# Patient Record
Sex: Male | Born: 1935 | Race: White | Hispanic: No | Marital: Single | Smoking: Former smoker
Health system: Southern US, Community
[De-identification: ages and names within clinical notes are randomized; demographics above are authoritative.]

## PROBLEM LIST (undated history)

## (undated) DIAGNOSIS — N289 Disorder of kidney and ureter, unspecified: Secondary | ICD-10-CM

## (undated) DIAGNOSIS — J449 Chronic obstructive pulmonary disease, unspecified: Secondary | ICD-10-CM

---

## 1993-06-10 HISTORY — PX: OTHER SURGICAL HISTORY: SHX169

## 2015-08-15 ENCOUNTER — Inpatient Hospital Stay (HOSPITAL_COMMUNITY)
Admission: EM | Admit: 2015-08-15 | Discharge: 2015-09-09 | DRG: 393 | Disposition: E | Payer: Medicaid Other | Attending: Pulmonary Disease | Admitting: Pulmonary Disease

## 2015-08-15 ENCOUNTER — Emergency Department (HOSPITAL_COMMUNITY): Payer: Medicaid Other

## 2015-08-15 ENCOUNTER — Encounter (HOSPITAL_COMMUNITY): Payer: Self-pay | Admitting: Emergency Medicine

## 2015-08-15 DIAGNOSIS — I723 Aneurysm of iliac artery: Secondary | ICD-10-CM | POA: Diagnosis present

## 2015-08-15 DIAGNOSIS — H5441 Blindness, right eye, normal vision left eye: Secondary | ICD-10-CM | POA: Diagnosis present

## 2015-08-15 DIAGNOSIS — R0902 Hypoxemia: Secondary | ICD-10-CM

## 2015-08-15 DIAGNOSIS — E7800 Pure hypercholesterolemia, unspecified: Secondary | ICD-10-CM | POA: Diagnosis present

## 2015-08-15 DIAGNOSIS — N289 Disorder of kidney and ureter, unspecified: Secondary | ICD-10-CM | POA: Diagnosis present

## 2015-08-15 DIAGNOSIS — E871 Hypo-osmolality and hyponatremia: Secondary | ICD-10-CM | POA: Diagnosis not present

## 2015-08-15 DIAGNOSIS — Z87891 Personal history of nicotine dependence: Secondary | ICD-10-CM

## 2015-08-15 DIAGNOSIS — I10 Essential (primary) hypertension: Secondary | ICD-10-CM | POA: Diagnosis present

## 2015-08-15 DIAGNOSIS — R6521 Severe sepsis with septic shock: Secondary | ICD-10-CM | POA: Diagnosis not present

## 2015-08-15 DIAGNOSIS — K921 Melena: Secondary | ICD-10-CM | POA: Diagnosis present

## 2015-08-15 DIAGNOSIS — A419 Sepsis, unspecified organism: Secondary | ICD-10-CM | POA: Diagnosis not present

## 2015-08-15 DIAGNOSIS — Z9981 Dependence on supplemental oxygen: Secondary | ICD-10-CM

## 2015-08-15 DIAGNOSIS — E872 Acidosis: Secondary | ICD-10-CM | POA: Diagnosis present

## 2015-08-15 DIAGNOSIS — Z7982 Long term (current) use of aspirin: Secondary | ICD-10-CM

## 2015-08-15 DIAGNOSIS — I2510 Atherosclerotic heart disease of native coronary artery without angina pectoris: Secondary | ICD-10-CM | POA: Diagnosis not present

## 2015-08-15 DIAGNOSIS — J9601 Acute respiratory failure with hypoxia: Secondary | ICD-10-CM | POA: Diagnosis not present

## 2015-08-15 DIAGNOSIS — I25119 Atherosclerotic heart disease of native coronary artery with unspecified angina pectoris: Secondary | ICD-10-CM | POA: Diagnosis present

## 2015-08-15 DIAGNOSIS — I248 Other forms of acute ischemic heart disease: Secondary | ICD-10-CM | POA: Diagnosis present

## 2015-08-15 DIAGNOSIS — I213 ST elevation (STEMI) myocardial infarction of unspecified site: Secondary | ICD-10-CM | POA: Diagnosis not present

## 2015-08-15 DIAGNOSIS — R109 Unspecified abdominal pain: Secondary | ICD-10-CM

## 2015-08-15 DIAGNOSIS — R339 Retention of urine, unspecified: Secondary | ICD-10-CM | POA: Diagnosis present

## 2015-08-15 DIAGNOSIS — E875 Hyperkalemia: Secondary | ICD-10-CM | POA: Diagnosis present

## 2015-08-15 DIAGNOSIS — K55039 Acute (reversible) ischemia of large intestine, extent unspecified: Secondary | ICD-10-CM | POA: Diagnosis present

## 2015-08-15 DIAGNOSIS — I713 Abdominal aortic aneurysm, ruptured: Secondary | ICD-10-CM | POA: Diagnosis not present

## 2015-08-15 DIAGNOSIS — I714 Abdominal aortic aneurysm, without rupture, unspecified: Secondary | ICD-10-CM | POA: Diagnosis present

## 2015-08-15 DIAGNOSIS — H3530 Unspecified macular degeneration: Secondary | ICD-10-CM | POA: Diagnosis present

## 2015-08-15 DIAGNOSIS — R1010 Upper abdominal pain, unspecified: Secondary | ICD-10-CM | POA: Diagnosis not present

## 2015-08-15 DIAGNOSIS — R1011 Right upper quadrant pain: Secondary | ICD-10-CM | POA: Diagnosis not present

## 2015-08-15 DIAGNOSIS — S0571XS Avulsion of right eye, sequela: Secondary | ICD-10-CM

## 2015-08-15 DIAGNOSIS — K529 Noninfective gastroenteritis and colitis, unspecified: Secondary | ICD-10-CM | POA: Diagnosis not present

## 2015-08-15 DIAGNOSIS — J9602 Acute respiratory failure with hypercapnia: Secondary | ICD-10-CM | POA: Insufficient documentation

## 2015-08-15 DIAGNOSIS — H409 Unspecified glaucoma: Secondary | ICD-10-CM | POA: Diagnosis present

## 2015-08-15 DIAGNOSIS — R7989 Other specified abnormal findings of blood chemistry: Secondary | ICD-10-CM | POA: Diagnosis not present

## 2015-08-15 DIAGNOSIS — D62 Acute posthemorrhagic anemia: Secondary | ICD-10-CM | POA: Diagnosis present

## 2015-08-15 DIAGNOSIS — I472 Ventricular tachycardia: Secondary | ICD-10-CM | POA: Diagnosis not present

## 2015-08-15 DIAGNOSIS — E876 Hypokalemia: Secondary | ICD-10-CM | POA: Diagnosis not present

## 2015-08-15 DIAGNOSIS — Z951 Presence of aortocoronary bypass graft: Secondary | ICD-10-CM | POA: Diagnosis not present

## 2015-08-15 DIAGNOSIS — J80 Acute respiratory distress syndrome: Secondary | ICD-10-CM | POA: Diagnosis not present

## 2015-08-15 DIAGNOSIS — Z79899 Other long term (current) drug therapy: Secondary | ICD-10-CM

## 2015-08-15 DIAGNOSIS — I25709 Atherosclerosis of coronary artery bypass graft(s), unspecified, with unspecified angina pectoris: Secondary | ICD-10-CM | POA: Insufficient documentation

## 2015-08-15 DIAGNOSIS — R0682 Tachypnea, not elsewhere classified: Secondary | ICD-10-CM

## 2015-08-15 DIAGNOSIS — K559 Vascular disorder of intestine, unspecified: Secondary | ICD-10-CM

## 2015-08-15 DIAGNOSIS — G9340 Encephalopathy, unspecified: Secondary | ICD-10-CM | POA: Diagnosis present

## 2015-08-15 DIAGNOSIS — R1310 Dysphagia, unspecified: Secondary | ICD-10-CM | POA: Diagnosis present

## 2015-08-15 DIAGNOSIS — R682 Dry mouth, unspecified: Secondary | ICD-10-CM | POA: Diagnosis not present

## 2015-08-15 DIAGNOSIS — J410 Simple chronic bronchitis: Secondary | ICD-10-CM | POA: Diagnosis not present

## 2015-08-15 DIAGNOSIS — R0989 Other specified symptoms and signs involving the circulatory and respiratory systems: Secondary | ICD-10-CM

## 2015-08-15 DIAGNOSIS — Z880 Allergy status to penicillin: Secondary | ICD-10-CM

## 2015-08-15 DIAGNOSIS — R198 Other specified symptoms and signs involving the digestive system and abdomen: Secondary | ICD-10-CM

## 2015-08-15 DIAGNOSIS — J449 Chronic obstructive pulmonary disease, unspecified: Secondary | ICD-10-CM | POA: Insufficient documentation

## 2015-08-15 DIAGNOSIS — K922 Gastrointestinal hemorrhage, unspecified: Secondary | ICD-10-CM | POA: Diagnosis present

## 2015-08-15 DIAGNOSIS — Z7951 Long term (current) use of inhaled steroids: Secondary | ICD-10-CM

## 2015-08-15 DIAGNOSIS — J441 Chronic obstructive pulmonary disease with (acute) exacerbation: Secondary | ICD-10-CM | POA: Diagnosis present

## 2015-08-15 DIAGNOSIS — R739 Hyperglycemia, unspecified: Secondary | ICD-10-CM | POA: Diagnosis not present

## 2015-08-15 DIAGNOSIS — Z9289 Personal history of other medical treatment: Secondary | ICD-10-CM

## 2015-08-15 DIAGNOSIS — Z888 Allergy status to other drugs, medicaments and biological substances status: Secondary | ICD-10-CM

## 2015-08-15 DIAGNOSIS — R778 Other specified abnormalities of plasma proteins: Secondary | ICD-10-CM | POA: Insufficient documentation

## 2015-08-15 DIAGNOSIS — Z01818 Encounter for other preprocedural examination: Secondary | ICD-10-CM

## 2015-08-15 HISTORY — DX: Chronic obstructive pulmonary disease, unspecified: J44.9

## 2015-08-15 HISTORY — DX: Disorder of kidney and ureter, unspecified: N28.9

## 2015-08-15 LAB — LACTIC ACID, PLASMA: Lactic Acid, Venous: 2.1 mmol/L (ref 0.5–2.0)

## 2015-08-15 LAB — CBC WITH DIFFERENTIAL/PLATELET
Basophils Absolute: 0 10*3/uL (ref 0.0–0.1)
Basophils Relative: 0 %
EOS PCT: 0 %
Eosinophils Absolute: 0 10*3/uL (ref 0.0–0.7)
HCT: 33.8 % — ABNORMAL LOW (ref 39.0–52.0)
Hemoglobin: 10.9 g/dL — ABNORMAL LOW (ref 13.0–17.0)
LYMPHS ABS: 1 10*3/uL (ref 0.7–4.0)
LYMPHS PCT: 5 %
MCH: 28.5 pg (ref 26.0–34.0)
MCHC: 32.2 g/dL (ref 30.0–36.0)
MCV: 88.3 fL (ref 78.0–100.0)
MONOS PCT: 5 %
Monocytes Absolute: 1.1 10*3/uL — ABNORMAL HIGH (ref 0.1–1.0)
Neutro Abs: 19.2 10*3/uL — ABNORMAL HIGH (ref 1.7–7.7)
Neutrophils Relative %: 90 %
PLATELETS: 121 10*3/uL — AB (ref 150–400)
RBC: 3.83 MIL/uL — AB (ref 4.22–5.81)
RDW: 13.5 % (ref 11.5–15.5)
WBC: 21.3 10*3/uL — AB (ref 4.0–10.5)

## 2015-08-15 LAB — TYPE AND SCREEN
ABO/RH(D): O POS
Antibody Screen: NEGATIVE

## 2015-08-15 LAB — POC OCCULT BLOOD, ED: FECAL OCCULT BLD: POSITIVE — AB

## 2015-08-15 LAB — I-STAT CHEM 8, ED
BUN: 44 mg/dL — ABNORMAL HIGH (ref 6–20)
CHLORIDE: 91 mmol/L — AB (ref 101–111)
Calcium, Ion: 1.23 mmol/L (ref 1.13–1.30)
Creatinine, Ser: 1.7 mg/dL — ABNORMAL HIGH (ref 0.61–1.24)
Glucose, Bld: 144 mg/dL — ABNORMAL HIGH (ref 65–99)
HCT: 40 % (ref 39.0–52.0)
Hemoglobin: 13.6 g/dL (ref 13.0–17.0)
POTASSIUM: 5.3 mmol/L — AB (ref 3.5–5.1)
SODIUM: 131 mmol/L — AB (ref 135–145)
TCO2: 30 mmol/L (ref 0–100)

## 2015-08-15 LAB — PROTIME-INR
INR: 1.4 (ref 0.00–1.49)
PROTHROMBIN TIME: 17.3 s — AB (ref 11.6–15.2)

## 2015-08-15 LAB — LIPID PANEL
Cholesterol: 134 mg/dL (ref 0–200)
HDL: 44 mg/dL (ref >40)
LDL CALC: 72 mg/dL (ref 0–99)
Total CHOL/HDL Ratio: 3 RATIO
Triglycerides: 88 mg/dL (ref <150)
VLDL: 18 mg/dL (ref 0–40)

## 2015-08-15 LAB — TROPONIN I
Troponin I: 0.08 ng/mL — ABNORMAL HIGH (ref <0.031)
Troponin I: 0.08 ng/mL — ABNORMAL HIGH (ref <0.031)

## 2015-08-15 LAB — CBC
HCT: 30.7 % — ABNORMAL LOW (ref 39.0–52.0)
Hemoglobin: 9.8 g/dL — ABNORMAL LOW (ref 13.0–17.0)
MCH: 28.1 pg (ref 26.0–34.0)
MCHC: 31.9 g/dL (ref 30.0–36.0)
MCV: 88 fL (ref 78.0–100.0)
PLATELETS: 117 10*3/uL — AB (ref 150–400)
RBC: 3.49 MIL/uL — ABNORMAL LOW (ref 4.22–5.81)
RDW: 13.7 % (ref 11.5–15.5)
WBC: 16.7 10*3/uL — AB (ref 4.0–10.5)

## 2015-08-15 LAB — BASIC METABOLIC PANEL
Anion gap: 17 — ABNORMAL HIGH (ref 5–15)
BUN: 42 mg/dL — AB (ref 6–20)
CHLORIDE: 91 mmol/L — AB (ref 101–111)
CO2: 26 mmol/L (ref 22–32)
CREATININE: 1.94 mg/dL — AB (ref 0.61–1.24)
Calcium: 10 mg/dL (ref 8.9–10.3)
GFR calc non Af Amer: 30 mL/min — ABNORMAL LOW (ref >60)
GFR, EST AFRICAN AMERICAN: 35 mL/min — AB (ref >60)
GLUCOSE: 148 mg/dL — AB (ref 65–99)
Potassium: 5.6 mmol/L — ABNORMAL HIGH (ref 3.5–5.1)
Sodium: 134 mmol/L — ABNORMAL LOW (ref 135–145)

## 2015-08-15 LAB — ABO/RH: ABO/RH(D): O POS

## 2015-08-15 LAB — I-STAT CG4 LACTIC ACID, ED: LACTIC ACID, VENOUS: 2.1 mmol/L — AB (ref 0.5–2.0)

## 2015-08-15 LAB — TSH: TSH: 1.285 u[IU]/mL (ref 0.350–4.500)

## 2015-08-15 MED ORDER — ALBUTEROL SULFATE (2.5 MG/3ML) 0.083% IN NEBU
2.5000 mg | INHALATION_SOLUTION | Freq: Four times a day (QID) | RESPIRATORY_TRACT | Status: DC
  Administered 2015-08-16 (×3): 2.5 mg via RESPIRATORY_TRACT
  Filled 2015-08-15 (×3): qty 3

## 2015-08-15 MED ORDER — HEPARIN SODIUM (PORCINE) 5000 UNIT/ML IJ SOLN
5000.0000 [IU] | Freq: Three times a day (TID) | INTRAMUSCULAR | Status: DC
  Administered 2015-08-15: 5000 [IU] via SUBCUTANEOUS
  Filled 2015-08-15: qty 1

## 2015-08-15 MED ORDER — DEXTROSE-NACL 5-0.45 % IV SOLN
INTRAVENOUS | Status: DC
  Administered 2015-08-15 – 2015-08-17 (×2): via INTRAVENOUS

## 2015-08-15 MED ORDER — CIPROFLOXACIN IN D5W 400 MG/200ML IV SOLN: 400.0000 mg | Freq: Once | INTRAVENOUS | Status: DC

## 2015-08-15 MED ORDER — IPRATROPIUM BROMIDE 0.02 % IN SOLN
0.5000 mg | Freq: Four times a day (QID) | RESPIRATORY_TRACT | Status: DC
  Administered 2015-08-16 (×3): 0.5 mg via RESPIRATORY_TRACT
  Filled 2015-08-15 (×3): qty 2.5

## 2015-08-15 MED ORDER — IPRATROPIUM BROMIDE HFA 17 MCG/ACT IN AERS: 2.0000 | INHALATION_SPRAY | Freq: Four times a day (QID) | RESPIRATORY_TRACT | Status: DC

## 2015-08-15 MED ORDER — METRONIDAZOLE IN NACL 5-0.79 MG/ML-% IV SOLN
500.0000 mg | Freq: Three times a day (TID) | INTRAVENOUS | Status: DC
  Administered 2015-08-15: 500 mg via INTRAVENOUS
  Filled 2015-08-15: qty 100

## 2015-08-15 MED ORDER — ATROPINE SULFATE 1 % OP SOLN
1.0000 [drp] | Freq: Two times a day (BID) | OPHTHALMIC | Status: DC
  Administered 2015-08-16 – 2015-08-25 (×18): 1 [drp] via OPHTHALMIC
  Filled 2015-08-15 (×4): qty 2

## 2015-08-15 MED ORDER — IOHEXOL 350 MG/ML SOLN
80.0000 mL | Freq: Once | INTRAVENOUS | Status: AC | PRN
  Administered 2015-08-15: 80 mL via INTRAVENOUS

## 2015-08-15 MED ORDER — MOMETASONE FURO-FORMOTEROL FUM 100-5 MCG/ACT IN AERO
2.0000 | INHALATION_SPRAY | Freq: Two times a day (BID) | RESPIRATORY_TRACT | Status: DC
  Administered 2015-08-16 – 2015-08-25 (×12): 2 via RESPIRATORY_TRACT
  Filled 2015-08-15 (×3): qty 8.8

## 2015-08-15 MED ORDER — ALBUTEROL SULFATE (2.5 MG/3ML) 0.083% IN NEBU
2.5000 mg | INHALATION_SOLUTION | Freq: Four times a day (QID) | RESPIRATORY_TRACT | Status: DC | PRN
  Administered 2015-08-19 – 2015-08-20 (×3): 2.5 mg via RESPIRATORY_TRACT
  Filled 2015-08-15 (×4): qty 3

## 2015-08-15 MED ORDER — GABAPENTIN 400 MG PO CAPS
400.0000 mg | ORAL_CAPSULE | Freq: Three times a day (TID) | ORAL | Status: DC
  Administered 2015-08-15 – 2015-08-20 (×15): 400 mg via ORAL
  Filled 2015-08-15 (×16): qty 1

## 2015-08-15 MED ORDER — POLYVINYL ALCOHOL 1.4 % OP SOLN
1.0000 [drp] | Freq: Four times a day (QID) | OPHTHALMIC | Status: DC | PRN
  Filled 2015-08-15: qty 15

## 2015-08-15 MED ORDER — LEVOFLOXACIN IN D5W 750 MG/150ML IV SOLN: 750.0000 mg | Freq: Once | INTRAVENOUS | Status: DC

## 2015-08-15 MED ORDER — METRONIDAZOLE IN NACL 5-0.79 MG/ML-% IV SOLN: 500.0000 mg | Freq: Once | INTRAVENOUS | Status: DC

## 2015-08-15 MED ORDER — SODIUM CHLORIDE 0.9 % IV BOLUS (SEPSIS)
1000.0000 mL | Freq: Once | INTRAVENOUS | Status: AC
  Administered 2015-08-15: 1000 mL via INTRAVENOUS

## 2015-08-15 MED ORDER — METOPROLOL TARTRATE 12.5 MG HALF TABLET
12.5000 mg | ORAL_TABLET | Freq: Two times a day (BID) | ORAL | Status: DC
Start: 2015-08-15 — End: 2015-08-16
  Administered 2015-08-15: 12.5 mg via ORAL
  Filled 2015-08-15 (×3): qty 1

## 2015-08-15 MED ORDER — LEVOFLOXACIN IN D5W 750 MG/150ML IV SOLN
750.0000 mg | INTRAVENOUS | Status: DC
  Administered 2015-08-15: 750 mg via INTRAVENOUS
  Filled 2015-08-15: qty 150

## 2015-08-15 MED ORDER — PRAVASTATIN SODIUM 40 MG PO TABS
40.0000 mg | ORAL_TABLET | Freq: Every day | ORAL | Status: DC
  Administered 2015-08-16 – 2015-08-24 (×7): 40 mg via ORAL
  Filled 2015-08-15 (×10): qty 1

## 2015-08-15 MED ORDER — PIPERACILLIN-TAZOBACTAM 3.375 G IVPB 30 MIN: 3.3750 g | Freq: Once | INTRAVENOUS | Status: DC

## 2015-08-15 MED ORDER — HYPROMELLOSE (GONIOSCOPIC) 2.5 % OP SOLN: 1.0000 [drp] | Freq: Four times a day (QID) | OPHTHALMIC | Status: DC | PRN

## 2015-08-15 MED ORDER — SODIUM CHLORIDE 0.9% FLUSH
3.0000 mL | Freq: Two times a day (BID) | INTRAVENOUS | Status: DC
  Administered 2015-08-15 – 2015-08-25 (×11): 3 mL via INTRAVENOUS

## 2015-08-15 NOTE — H&P (Signed)
[facility] History and Physical Service Pager: [phone]  Patient name: Micheal Orozco Medical record number: [id number] Date of birth: 06/25/1929 Age: 82 y.o. Gender: male  Primary Care Provider: No primary care provider on file. Consultants: Vascular Surgery Code Status: Full  Chief Complaint: Bloody stools  Assessment and Plan: Micheal Orozco is a 82 y.o. male presenting with hematochezia 1 day. PMH is significant for COPD, CAD, H/o CABG, hypertension, glaucoma, macular degeneration.  # Hematochezia, likely secondary to colitis: Patient reports 1 day history of hematochezia. He reports substantial constipation over the past couple days. He [facility]. Now having BRBPR. Never experienced this before. CT showed evidence of sigmoidal colitis. Differential includes infectious versus inflammatory versus ischemia (less likely due to no association with increased pain with food) versus hemorrhoidal (unlikely secondary to CT findings).  - Admit under telemetry family medicine teaching service; attending Dr. Lum BabeEniola - Patient is to be kept NPO - Enteric precautions - IV Levaquin and Flagyl per pharmacy initiated in ED >> continue - GI panel pending - Trend lactic acid - IV fluids - Lipid panel - Monitor CBCs for acute blood-loss anemia - Type and cross blood - Consult GI in a.m.  # Abdominal aortic aneurysm: 9.7 cm diameter noted on CT. Vascular surgery consulted in ED - Vascular surgery likely to perform repair once colitis/hematochezia resolves. - Monitor  # Lactic acidosis: Likely secondary to colitis - Trend lactic acids - IV fluid replacement  # COPD: Patient requires supplemental O2 at home at 2L. Endorses some mild increase in shortness of breath without chest pain or tightness. No increase of supplemental O2 required at this time. - Continue home medications: Dulera, Atrovent, albuterol. -  Continue supplemental oxygen as needed  # Renal dysfunction: Unknown if this is CKD versus AKI as patient does not have a baseline noted in our chart, and last creatinine from [facility] was from 1995 (Cr 1.1). - Avoid nephrotoxic medications. - Will monitor creatinine; anticipate improvement with fluid replacement as patient's BUN/creatinine ratio is currently >20, providing a scenario of possible prerenal AKI.  # Urinary retention: 1 day. Patient states he has never experienced this in the past. Bladder scan by nursing yielded >600 mL's. Differential includes BPH versus prostatitis versus drug-induced versus regional inflammation/edema secondary to colitis. - Foley catheter placed - We'll continue to monitor - Holding antihistamine medications (chlorpheniramine) - Reassess in AM  # CAD, s/p CABG: Denies chest pain at this time. Physical exam yielded palpable peripheral pulses however capillary refill suggests underlying/undiagnosed PAD. - Cycle troponins - EKG: Some lateral T-wave abnormalities. Unfortunately no previous EKG to compare with.  - Consider cardiology consultation if troponins elevate - Continue home dose of metoprolol, and pravastatin  # Hypertension - Metoprolol  # Glaucoma (right eye) and bilateral macular degeneration: s/p aqueous shunt placement. Blind in right eye.  - Continue home ophthalmic treatment: Atropine ophthalmic solution, Liquifilm Tears ophthalmic solution.  # Skin lesions: s/p skin biopsies of ears and nose [facility]   FEN/GI: D5 half-normal saline at 125 mL/hour; NPO Prophylaxis: SCDs  Disposition: Back to jail once medically cleared  History of Present Illness:  Micheal Orozco is a 82 y.o. male presenting with a 1 day history of hematochezia. Patient was [facility] earlier [facility] and [facility] for issues with constipation. He was disimpacted and sent home. According to the ED physician was then  transported back after experiencing  significant hematochezia and lethargy. Patient states he has never had anything like this in the past. Hematochezia is only been present for 1 day. He denies significant abdominal pain and has no increased pain with consumption of food. He denies any headache, vision changes, chest pain, new shortness of breath, fever, chills, syncope, numbness, weakness.  In the ED imaging was performed of his abdomen which yielded evidence of thickening within the walls of the descending/sigmoid colon and rectum. These changes are consistent with inflammatory/infectious/ischemic changes. CT also yielded evidence of a substantial abdominal aortic aneurysm. Aneurysm measured 9.7 cm in diameter. Vascular surgery was consult in the ED and will follow the patient.  Review Of Systems: Per HPI Otherwise the remainder of the systems were negative.  Patient Active Problem List   Diagnosis Date Noted  . AAA (abdominal aortic aneurysm) (HCC) 09/03/2015  . Colitis, acute 08/17/2015    Past Medical History: Past Medical History  Diagnosis Date  . COPD (chronic obstructive pulmonary disease) (HCC)     Past Surgical History: Past Surgical History  Procedure Laterality Date  . Triple bypass  1995    Social History: Social History  Substance Use Topics  . Smoking status: Former Smoker    Types: Cigarettes    Quit date: 08/14/1993  . Smokeless tobacco: None  . Alcohol Use: No   Additional social history: Patient serving a life sentence in jail.  Please also refer to relevant sections of EMR.  Family History: Patient states he is "unsure" of any significant GI or hematologic family history.  Allergies and Medications: Allergies  Allergen Reactions  . Penicillins   . Sulindac    No current facility-administered medications on file prior to encounter.   No current outpatient prescriptions on file prior to encounter.    Objective: BP 127/88 mmHg  Pulse 111   Temp(Src) 98.3 F (36.8 C) (Oral)  Resp 18  Ht  (1.727 m)  Wt 152 lb (68.947 kg)  BMI 23.12 kg/m2  SpO2 100% Exam: General -- oriented x3, pleasant and cooperative.  HEENT -- Head is normocephalic. Right eye is severely diseased and left eye has substantial macular degenerative changes noted. EOMI. Bilateral healing wounds around the ears and nose. Neck -- supple; no bruits. Integument -- intact. No rash, erythema, or ecchymoses.  Chest -- prolonged expiratory phase. Occasional wheeze noted in bilateral middle lung fields. Cardiac -- tachycardic, regular rhythm, distant heart sounds Abdomen -- soft, mildly tender in the LLQ. Large pulsatile mass palpable. Bowel sounds present. CNS -- cranial nerves II through XII grossly intact. Extremeties - no tenderness or effusions noted. Capillary refill of lower extremity digits prolonged (>3sec). Dorsalis pedis pulses present and symmetrical.    Labs and Imaging: CBC BMET   Recent Labs Lab 08/27/2015 2240  WBC 16.7*  HGB 9.8*  HCT 30.7*  PLT 117*    Recent Labs Lab 08/14/2015 1610 08/09/2015 1619  NA 134* 131*  K 5.6* 5.3*  CL 91* 91*  CO2 26  --   BUN 42* 44*  CREATININE 1.94* 1.70*  GLUCOSE 148* 144*  CALCIUM 10.0  --       Kathee Delton, MD 09/06/2015, 1:11 AM PGY-2, [facility] FPTS Intern pager: [phone], text pages welcome

## 2015-08-15 NOTE — Progress Notes (Signed)
Critical lab value of 2.1 lactic acid called by Clydie BraunKaren from at on 08/26/2015 at 2327. Dr.Mckeag notified at 2334. Continue with orders given.

## 2015-08-15 NOTE — Consult Note (Signed)
Consult Note  Patient name: Micheal Orozco MRN: [id number] DOB: 04/11/1930 Sex: male  Consulting Physician:  ER  Reason for Consult:  Chief Complaint  Patient presents with  . Rectal Bleeding  . Shortness of Breath    HISTORY OF PRESENT ILLNESS: This is an 82 year old gentleman who [facility] earlier this morning for constipation.  He was disimpacted and [facility]  He was found to be cyanotic and unresponsive and bloody feces at the [facility] and then brought via EMS to [facility].  Currently, he says that he feels very good.  He does not endorse any abdominal pain.  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  The patient states that he has a history of cardiac disease and is status post CABG in 1995.  He takes a statin for hypercholesterolemia.  He is medically managed for hypertension.  He is a former smoker but quit several years ago.  Past Medical History  Diagnosis Date  . COPD (chronic obstructive pulmonary disease) [facility](HCC)     Past Surgical History  Procedure Laterality Date  . Triple bypass  1995    Social History   Social History  . Marital Status: Single    Spouse Name: N/A  . Number of Children: N/A  . Years of Education: N/A   Occupational History  . Not on file.   Social History Main Topics  . Smoking status: Former Smoker    Types: Cigarettes    Quit date: 08/14/1993  . Smokeless tobacco: Not on file  . Alcohol Use: No  . Drug Use: No  . Sexual Activity: Not on file   Other Topics Concern  . Not on file   Social History Narrative  . No narrative on file   Family history: Negative for premature cardiovascular disease  Allergies as of 03/13/2016 - Review Complete 03/13/2016  Allergen Reaction Noted  . Penicillins  03/13/2016  . Sulindac  03/13/2016     No current facility-administered medications on file prior to encounter.   No current outpatient prescriptions on file prior to encounter.     REVIEW OF SYSTEMS: Please see history of present illness otherwise negative  PHYSICAL EXAMINATION: General: The patient appears their stated age.  Vital signs are BP 151/96 mmHg  Pulse 111  Temp(Src) 98.6 F (37 C) (Oral)  Resp 19  Ht 5\' 8"  (1.727 m)  Wt 152 lb (68.947 kg)  BMI 23.12 kg/m2  SpO2 99% Pulmonary: Respirations are non-labored HEENT:  No gross abnormalities Abdomen: Abdomen is soft.  He has a large pulsatile mass which is nontender.  He does endorse some pain in the left lower quadrant.  Musculoskeletal: There are no major deformities.   Neurologic: No focal weakness or paresthesias are detected, Skin: There are no ulcer or rashes noted. Psychiatric: The patient has normal affect. Cardiovascular: There is a regular rate and rhythm without significant murmur appreciated.  Palpable pedal pulses  Diagnostic Studies: I have reviewed his CT scan which shows a large infrarenal abdominal aortic aneurysm measuring 10.1 cm.  The right common iliac aneurysm measures 4.1 cm.  There is also wall thickening in the distal colon with inflammatory changes in the perirectal fat    Assessment:  Large abdominal aortic aneurysm Sigmoid colitis Plan: I discussed with the patient  that he will need to have his aneurysm repaired in the immediate future, however with what I feel is an active inflammatory process within the sigmoid colon, this needs to resolve before fixing his aneurysm so as not to contaminate his graft.  I think he should remain in the hospital until his aneurysm has been repaired he'll be done once his pain and inflammatory changes in his colon have subsided.  I recommend IV antibiotics and admission to the hospital service.  I'll follow closely along.     Jorge Ny, M.D. [facility]  [place] Office: [phone] Pager:  [phone]

## 2015-08-15 NOTE — ED Provider Notes (Signed)
CSN: [id number]     Arrival date & time 09/01/2015  1410 History   First MD Initiated Contact with Patient 08/26/2015 1457     Chief Complaint  Patient presents with  . Rectal Bleeding  . Shortness of Breath   Patient is a 82 y.o. male presenting with hematochezia and shortness of breath. The history is provided by the patient A [employer] guard).  Rectal Bleeding Quality:  Bright red Amount:  Moderate Duration:  6 hours Timing:  Constant Progression:  Unchanged Chronicity:  New Context: constipation and diarrhea   Context comment:  Recent disimpaction Similar prior episodes: no   Relieved by:  None tried Worsened by:  Nothing tried Ineffective treatments:  None tried Associated symptoms: abdominal pain   Associated symptoms: no dizziness, no fever, no hematemesis, no light-headedness and no vomiting   Risk factors: no anticoagulant use and no NSAID use   Shortness of Breath Associated symptoms: abdominal pain   Associated symptoms: no chest pain, no cough, no ear pain, no fever, no headaches, no neck pain, no rash, no sore throat, no vomiting and no wheezing     Past Medical History  Diagnosis Date  . COPD (chronic obstructive pulmonary disease) [facility])    Past Surgical History  Procedure Laterality Date  . Triple bypass  1995   No family history on file. Social History  Substance Use Topics  . Smoking status: Former Smoker    Types: Cigarettes    Quit date: 08/14/1993  . Smokeless tobacco: None  . Alcohol Use: No    Review of Systems  Constitutional: Negative for fever, chills, activity change and appetite change.  HENT: Negative for congestion, dental problem, ear pain, facial swelling, hearing loss, rhinorrhea, sneezing, sore throat, trouble swallowing and voice change.   Eyes: Negative for photophobia, pain, redness and visual disturbance.  Respiratory: Positive for shortness of breath. Negative for apnea, cough, chest tightness, wheezing and stridor.   Cardiovascular:  Negative for chest pain, palpitations and leg swelling.  Gastrointestinal: Positive for abdominal pain, diarrhea, constipation, blood in stool and hematochezia. Negative for nausea, vomiting, abdominal distention and hematemesis.  Endocrine: Negative for polydipsia and polyuria.  Genitourinary: Negative for frequency, hematuria, flank pain, decreased urine volume and difficulty urinating.  Musculoskeletal: Negative for back pain, joint swelling, gait problem, neck pain and neck stiffness.  Skin: Negative for rash and wound.  Allergic/Immunologic: Negative for immunocompromised state.  Neurological: Negative for dizziness, syncope, facial asymmetry, speech difficulty, weakness, light-headedness, numbness and headaches.  Hematological: Negative for adenopathy.  Psychiatric/Behavioral: Negative for suicidal ideas, behavioral problems, confusion, sleep disturbance and agitation. The patient is not nervous/anxious.   All other systems reviewed and are negative.     Allergies  Penicillins and Sulindac  Home Medications   Prior to Admission medications   Medication Sig Start Date End Date Taking? Authorizing Provider  acetaminophen (RA ACETAMINOPHEN) 650 MG CR tablet Take 650 mg by mouth every 8 (eight) hours as needed. Pain    Historical Provider, MD  albuterol (PROVENTIL) (2.5 MG/3ML) 0.083% nebulizer solution Take 2.5 mg by nebulization every 6 (six) hours as needed for wheezing or shortness of breath.  08/08/09  Yes Historical Provider, MD  aspirin 81 MG tablet Take 81 mg by mouth daily. 08/08/09  Yes Historical Provider, MD  gabapentin (NEURONTIN) 300 MG capsule Take 300 mg by mouth 3 (three) times daily. 08/08/09  Yes Historical Provider, MD  guaiFENesin (MUCINEX) 600 MG 12 hr tablet Take 600 mg by mouth every 12 (twelve)  hours. 08/08/09  Yes Historical Provider, MD  ipratropium (ATROVENT HFA) 17 MCG/ACT inhaler Inhale 2 puffs into the lungs every 6 (six) hours as needed for wheezing.  08/08/09  Yes  Historical Provider, MD  lovastatin (MEVACOR) 40 MG tablet Take 40 mg by mouth at bedtime. 08/08/09  Yes Historical Provider, MD  metoprolol tartrate (LOPRESSOR) 25 MG tablet Take 25 mg by mouth 2 (two) times daily. 08/08/09  Yes Historical Provider, MD  prednisoLONE acetate (PRED FORTE) 1 % ophthalmic suspension Place 1 drop into both eyes 4 (four) times daily.    Historical Provider, MD  ranitidine (ZANTAC) 300 MG tablet Take 300 mg by mouth 2 (two) times daily. 08/08/09  Yes Historical Provider, MD   BP 111/78 mmHg  Pulse 109  Temp(Src) 98.6 F (37 C) (Oral)  Resp 24  Ht 5\' 8"  (1.727 m)  Wt 68.947 kg  BMI 23.12 kg/m2  SpO2 100% Physical Exam  Constitutional: He is oriented to person, place, and time. He appears well-developed and well-nourished. No distress.  HENT:  Head: Normocephalic and atraumatic.  Scars to bilateral ears.  Eyes: Right eye exhibits no discharge. Left eye exhibits no discharge.  Right eye enucleated  Neck: Normal range of motion. No JVD present. No tracheal deviation present.  Cardiovascular: Regular rhythm and normal heart sounds.  Tachycardia present.  Exam reveals no friction rub.   No murmur heard. Pulmonary/Chest: Effort normal and breath sounds normal. No stridor. No respiratory distress. He has no wheezes.  Abdominal: Soft. Bowel sounds are normal. He exhibits distension. There is tenderness. There is no rebound and no guarding.  Pulsatile abdominal mass, left lower quadrant  Genitourinary: Testes normal and penis normal. Rectal exam shows no external hemorrhoid, no internal hemorrhoid and no fissure. Guaiac positive stool.  Musculoskeletal: Normal range of motion. He exhibits no edema or tenderness.  Lymphadenopathy:    He has no cervical adenopathy.  Neurological: He is alert and oriented to person, place, and time. No cranial nerve deficit. Coordination normal.  Skin: Skin is warm and dry. No rash noted. No pallor.  Psychiatric: He has a normal mood and  affect. His behavior is normal. Judgment and thought content normal.  Nursing note and vitals reviewed.   ED Course  Procedures (including critical care time) Labs Review Labs Reviewed  BASIC METABOLIC PANEL - Abnormal; Notable for the following:    Sodium 134 (*)    Potassium 5.6 (*)    Chloride 91 (*)    Glucose, Bld 148 (*)    BUN 42 (*)    Creatinine, Ser 1.94 (*)    GFR calc non Af Amer 30 (*)    GFR calc Af Amer 35 (*)    Anion gap 17 (*)    All other components within normal limits  TROPONIN I - Abnormal; Notable for the following:    Troponin I 0.08 (*)    All other components within normal limits  CBC WITH DIFFERENTIAL/PLATELET - Abnormal; Notable for the following:    WBC 21.3 (*)    RBC 3.83 (*)    Hemoglobin 10.9 (*)    HCT 33.8 (*)    Platelets 121 (*)    Neutro Abs 19.2 (*)    Monocytes Absolute 1.1 (*)    All other components within normal limits  I-STAT CG4 LACTIC ACID, ED - Abnormal; Notable for the following:    Lactic Acid, Venous 2.10 (*)    All other components within normal limits  I-STAT CHEM 8,  ED - Abnormal; Notable for the following:    Sodium 131 (*)    Potassium 5.3 (*)    Chloride 91 (*)    BUN 44 (*)    Creatinine, Ser 1.70 (*)    Glucose, Bld 144 (*)    All other components within normal limits  POC OCCULT BLOOD, ED - Abnormal; Notable for the following:    Fecal Occult Bld POSITIVE (*)    All other components within normal limits  OCCULT BLOOD X 1 CARD TO LAB, STOOL  URINALYSIS, ROUTINE W REFLEX MICROSCOPIC (NOT AT [facility])  TYPE AND SCREEN  ABO/RH    Imaging Review Dg Chest 2 View  09/06/2015  CLINICAL DATA:  Shortness of Breath EXAM: CHEST  2 VIEW COMPARISON:  None. FINDINGS: Cardiomediastinal silhouette is unremarkable. Status post CABG. No infiltrate or pleural effusion. No pulmonary edema. Mild basilar scarring. Osteopenia and mild degenerative changes thoracic spine. Degenerative changes bilateral shoulders. IMPRESSION: No active  disease. Status post CABG. Mild basilar scarring. Osteopenia and mild degenerative changes thoracic spine. Electronically Signed   By: Natasha Mead M.D.   On: 09/07/2015 15:53   Ct Angio Abd/pel W/ And/or W/o  09/05/2015  CLINICAL DATA:  Nausea.  Constipation. EXAM: CTA ABDOMEN AND PELVIS wITHOUT AND WITH CONTRAST TECHNIQUE: Multidetector CT imaging of the abdomen and pelvis was performed using the standard protocol during bolus administration of intravenous contrast. Multiplanar reconstructed images and MIPs were obtained and reviewed to evaluate the vascular anatomy. CONTRAST:  80mL OMNIPAQUE IOHEXOL 350 MG/ML SOLN COMPARISON:  None. FINDINGS: There is a very large infrarenal abdominal aortic aneurysm. It is bilobed. Maximal AP and transverse diameters 9.7 and 10.1 cm respectively. The blood pool is poorly opacified within the aneurysm and iliac vasculature due to large volume. There is no extravasated contrast to suggest ruptured aneurysm. Mild narrowing of the celiac due to median arcuate ligament syndrome SMA is patent with mild atherosclerotic disease at its origin. Two right renal arteries and a single left renal artery are diminutive and patent. IMA origin is occluded.  Branches reconstitute Right common iliac artery aneurysm is 4.1 cm in caliber. Left common iliac artery aneurysm is 1.7 cm in caliber. External iliac arteries are poorly opacified. Diffuse atherosclerotic changes of the external iliac arteries are patent without obvious focal severe narrowing. Severe emphysema towards the lung bases is present. There are multi focal indeterminate pulmonary opacities at the lung base. 1.6 cm more central left lower lobe parenchymal abnormality on image 5. There is a more patchy appearing opacity in the lingula measuring 17 mm on image 6. Tiny right lower lobe abnormality measures 3 mm on image 5. Patchy pulmonary opacities in the posterior and lateral left lung base on image 10 Diffuse hepatic steatosis.  Gallbladder, spleen, pancreas, and adrenal glands are within normal limits. There is nonspecific perinephric stranding bilaterally worse on the left. There is significant wall thickening and wall edema involving the distal half of the descending colon, sigmoid colon, and rectum. There are significant inflammatory changes in the perirectal fat. No pneumatosis. No extraluminal bowel gas. No sign of significant diverticulosis in this portion of the colon. Right inguinal hernia contains adipose tissue. Review of the MIP images confirms the above findings. IMPRESSION: Very large infrarenal abdominal aortic aneurysm with a maximal diameter of 10.1 cm. Right common iliac artery aneurysm with a maximal caliber a 4.1 cm There is extensive wall thickening of the distal colon in a continuous fashion beginning in the descending colon and extending  to the rectum with associated inflammatory changes in the perirectal fat. Differential diagnosis includes inflammatory bowel disease, infectious colitis, pseudomembranous colitis, and ischemia. Electronically Signed   By: Jolaine Click M.D.   On: 08/27/2015 17:26   I have personally reviewed and evaluated these images and lab results as part of my medical decision-making.   EKG Interpretation   Date/Time:  Tuesday August 15 2015 14:33:46 EST Ventricular Rate:  111 PR Interval:  158 QRS Duration: 69 QT Interval:  299 QTC Calculation: 406 R Axis:   53 Text Interpretation:  Sinus tachycardia Ventricular premature complex  Nonspecific T abnormalities, lateral leads No significant change since  last tracing Confirmed by [facility]  MD, DAVID ([id number]) on 08/26/2015 3:08:47 PM      MDM   Final diagnoses:  Abdominal aortic aneurysm (AAA) without rupture [facility])    Patient with disimpaction this morning at [facility]. [facility] and found several hours later with rectal bleeding, cyanosis, oxygen saturations to 60%. He does wear home O2 and was hooked up to  empty oxygen tank this event.  Upon arrival patient with sinus tachycardia. He has coarse breath sounds. He has tender left lower quadrant with pulsatile mass.  Will obtain labs, chest x-ray, CT angiogram abdomen pelvis with contrast. Screen ordered, normal saline bolus given.   Lactate mildly elevated 2.10. CBC with white blood cell count 21.3. CT scan large infrarenal AAA measuring 9.7-10.1 cm.  Vascular surgeon on-call consulted, saw patient and recommended medicine admission for IV antibiotics due to extensive inflammatory changes in descending colon. Cipro Flagyl ordered an emergency department the patient was admitted to family medicine service. Vascular surgery to follow and perform operation on AAA while inpatient once infection clears up.  Patient admitted, to the floor with no further ED events. No further desaturations while on oxygen NAD.  I discussed case with my attending, Dr. Preston Fleeting.      Dan Humphreys, MD 08/24/2015 8119  Dione Booze, MD 08/29/2015 [phone]

## 2015-08-15 NOTE — Progress Notes (Signed)
Pharmacy Antibiotic Note  Micheal Orozco is a 82 y.o. male admitted on 08/30/2015 with intra-abdominal infection .  Pharmacy has been consulted for levaquin and flagyl dosing. WBC 21.3, Tmax 98.6. CrCl ~6430mL/min   Plan: Levaquin 750mg  IV Q48h  Flagyl 500mg  IV Q8  F/U renal fxn, c/s, LOT   Height: 5\' 8"  (172.7 cm) Weight: 152 lb (68.947 kg) IBW/kg (Calculated) : 68.4  Temp (24hrs), Avg:98.6 F (37 C), Min:98.6 F (37 C), Max:98.6 F (37 C)   Recent Labs Lab 08/09/2015 1610 08/24/2015 1619  WBC 21.3*  --   CREATININE 1.94* 1.70*  LATICACIDVEN  --  2.10*    Estimated Creatinine Clearance: 31.9 mL/min (by C-G formula based on Cr of 1.7).    Allergies  Allergen Reactions  . Penicillins   . Sulindac     Antimicrobials this admission: 3/7 Levaquin>>  3/7 Flagyl>>  Thank you for allowing pharmacy to be a part of this patient's care.  Vercie Pokorny C. Marvis MoellerMiles, PharmD Pharmacy Resident  Pager: [phone] 08/30/2015 6:25 PM

## 2015-08-15 NOTE — ED Notes (Signed)
Pt in via [facility] from [facility] 0[facility] in morning for constipation, was disimpacted there. Pt wears 2L O2 at baseline, O2 was off at facility per report. After trx, [facility]. When checked an hour later, pt was found cyanotic and unresponsive in bloody feces and empty O2 tank.

## 2015-08-15 NOTE — Progress Notes (Signed)
Bladder scanned pt. Results were . MD notified.

## 2015-08-15 NOTE — ED Notes (Signed)
Dr. Manus Gunningancour asked for a repeat EKG.

## 2015-08-16 ENCOUNTER — Encounter (HOSPITAL_COMMUNITY): Admission: EM | Disposition: E | Payer: Self-pay | Source: Home / Self Care | Attending: Family Medicine

## 2015-08-16 ENCOUNTER — Encounter (HOSPITAL_COMMUNITY): Payer: Self-pay | Admitting: Family Medicine

## 2015-08-16 ENCOUNTER — Inpatient Hospital Stay (HOSPITAL_COMMUNITY): Payer: Medicaid Other

## 2015-08-16 DIAGNOSIS — I714 Abdominal aortic aneurysm, without rupture, unspecified: Secondary | ICD-10-CM | POA: Diagnosis present

## 2015-08-16 DIAGNOSIS — E871 Hypo-osmolality and hyponatremia: Secondary | ICD-10-CM | POA: Insufficient documentation

## 2015-08-16 DIAGNOSIS — R778 Other specified abnormalities of plasma proteins: Secondary | ICD-10-CM | POA: Insufficient documentation

## 2015-08-16 DIAGNOSIS — E875 Hyperkalemia: Secondary | ICD-10-CM

## 2015-08-16 DIAGNOSIS — K922 Gastrointestinal hemorrhage, unspecified: Secondary | ICD-10-CM

## 2015-08-16 DIAGNOSIS — I2510 Atherosclerotic heart disease of native coronary artery without angina pectoris: Secondary | ICD-10-CM | POA: Diagnosis present

## 2015-08-16 DIAGNOSIS — I25709 Atherosclerosis of coronary artery bypass graft(s), unspecified, with unspecified angina pectoris: Secondary | ICD-10-CM | POA: Insufficient documentation

## 2015-08-16 DIAGNOSIS — R339 Retention of urine, unspecified: Secondary | ICD-10-CM

## 2015-08-16 DIAGNOSIS — N289 Disorder of kidney and ureter, unspecified: Secondary | ICD-10-CM | POA: Diagnosis present

## 2015-08-16 DIAGNOSIS — K529 Noninfective gastroenteritis and colitis, unspecified: Secondary | ICD-10-CM

## 2015-08-16 DIAGNOSIS — R7989 Other specified abnormal findings of blood chemistry: Secondary | ICD-10-CM

## 2015-08-16 DIAGNOSIS — R109 Unspecified abdominal pain: Secondary | ICD-10-CM

## 2015-08-16 LAB — GLUCOSE, CAPILLARY
GLUCOSE-CAPILLARY: 133 mg/dL — AB (ref 65–99)
Glucose-Capillary: 136 mg/dL — ABNORMAL HIGH (ref 65–99)

## 2015-08-16 LAB — CBC
HCT: 27 % — ABNORMAL LOW (ref 39.0–52.0)
HCT: 27.6 % — ABNORMAL LOW (ref 39.0–52.0)
HEMATOCRIT: 27.3 % — AB (ref 39.0–52.0)
HEMATOCRIT: 25.5 % — AB (ref 39.0–52.0)
HEMOGLOBIN: 8.4 g/dL — AB (ref 13.0–17.0)
HEMOGLOBIN: 8.9 g/dL — AB (ref 13.0–17.0)
HEMOGLOBIN: 8.4 g/dL — AB (ref 13.0–17.0)
Hemoglobin: 8.8 g/dL — ABNORMAL LOW (ref 13.0–17.0)
MCH: 27.2 pg (ref 26.0–34.0)
MCH: 28.4 pg (ref 26.0–34.0)
MCH: 28.6 pg (ref 26.0–34.0)
MCH: 29.1 pg (ref 26.0–34.0)
MCHC: 31.1 g/dL (ref 30.0–36.0)
MCHC: 32.2 g/dL (ref 30.0–36.0)
MCHC: 32.2 g/dL (ref 30.0–36.0)
MCHC: 32.9 g/dL (ref 30.0–36.0)
MCV: 87.4 fL (ref 78.0–100.0)
MCV: 88.2 fL (ref 78.0–100.0)
MCV: 88.6 fL (ref 78.0–100.0)
MCV: 88.2 fL (ref 78.0–100.0)
PLATELETS: 104 10*3/uL — AB (ref 150–400)
PLATELETS: 94 10*3/uL — AB (ref 150–400)
Platelets: 92 10*3/uL — ABNORMAL LOW (ref 150–400)
Platelets: 86 10*3/uL — ABNORMAL LOW (ref 150–400)
RBC: 3.09 MIL/uL — ABNORMAL LOW (ref 4.22–5.81)
RBC: 3.13 MIL/uL — AB (ref 4.22–5.81)
RBC: 3.08 MIL/uL — AB (ref 4.22–5.81)
RBC: 2.89 MIL/uL — ABNORMAL LOW (ref 4.22–5.81)
RDW: 13.6 % (ref 11.5–15.5)
RDW: 13.8 % (ref 11.5–15.5)
RDW: 13.7 % (ref 11.5–15.5)
RDW: 13.6 % (ref 11.5–15.5)
WBC: 13.2 10*3/uL — ABNORMAL HIGH (ref 4.0–10.5)
WBC: 11.9 10*3/uL — AB (ref 4.0–10.5)
WBC: 11.8 10*3/uL — ABNORMAL HIGH (ref 4.0–10.5)
WBC: 10.3 10*3/uL (ref 4.0–10.5)

## 2015-08-16 LAB — NM MYOCAR MULTI W/SPECT W/WALL MOTION / EF
CHL CUP NUCLEAR SSS: 19
LHR: 0.43
LVDIAVOL: 115 mL (ref 62–150)
LVSYSVOL: 73 mL
NUC STRESS TID: 0.99
Rest HR: 100 {beats}/min
SDS: 4
SRS: 15

## 2015-08-16 LAB — TROPONIN I
TROPONIN I: 0.09 ng/mL — AB (ref <0.031)
Troponin I: 0.06 ng/mL — ABNORMAL HIGH (ref <0.031)

## 2015-08-16 LAB — COMPREHENSIVE METABOLIC PANEL
ALT: 21 U/L (ref 17–63)
ANION GAP: 9 (ref 5–15)
AST: 70 U/L — ABNORMAL HIGH (ref 15–41)
Albumin: 2.2 g/dL — ABNORMAL LOW (ref 3.5–5.0)
Alkaline Phosphatase: 58 U/L (ref 38–126)
BILIRUBIN TOTAL: 0.9 mg/dL (ref 0.3–1.2)
BUN: 40 mg/dL — ABNORMAL HIGH (ref 6–20)
CALCIUM: 8.7 mg/dL — AB (ref 8.9–10.3)
CO2: 27 mmol/L (ref 22–32)
CREATININE: 1.41 mg/dL — AB (ref 0.61–1.24)
Chloride: 97 mmol/L — ABNORMAL LOW (ref 101–111)
GFR, EST AFRICAN AMERICAN: 52 mL/min — AB (ref >60)
GFR, EST NON AFRICAN AMERICAN: 44 mL/min — AB (ref >60)
Glucose, Bld: 137 mg/dL — ABNORMAL HIGH (ref 65–99)
Potassium: 5 mmol/L (ref 3.5–5.1)
SODIUM: 133 mmol/L — AB (ref 135–145)
TOTAL PROTEIN: 5.8 g/dL — AB (ref 6.5–8.1)

## 2015-08-16 LAB — LACTIC ACID, PLASMA: LACTIC ACID, VENOUS: 0.9 mmol/L (ref 0.5–2.0)

## 2015-08-16 LAB — HIV ANTIBODY (ROUTINE TESTING W REFLEX): HIV Screen 4th Generation wRfx: NONREACTIVE

## 2015-08-16 SURGERY — CANCELLED PROCEDURE

## 2015-08-16 MED ORDER — METOPROLOL TARTRATE 25 MG PO TABS
25.0000 mg | ORAL_TABLET | Freq: Two times a day (BID) | ORAL | Status: DC
  Administered 2015-08-17 (×2): 25 mg via ORAL
  Filled 2015-08-16 (×2): qty 1

## 2015-08-16 MED ORDER — TECHNETIUM TC 99M SESTAMIBI GENERIC - CARDIOLITE
10.0000 | Freq: Once | INTRAVENOUS | Status: AC | PRN
  Administered 2015-08-16: 10 via INTRAVENOUS

## 2015-08-16 MED ORDER — TECHNETIUM TC 99M SESTAMIBI GENERIC - CARDIOLITE
30.0000 | Freq: Once | INTRAVENOUS | Status: AC | PRN
  Administered 2015-08-16: 30 via INTRAVENOUS

## 2015-08-16 MED ORDER — REGADENOSON 0.4 MG/5ML IV SOLN
0.4000 mg | Freq: Once | INTRAVENOUS | Status: AC
  Administered 2015-08-16: 0.4 mg via INTRAVENOUS

## 2015-08-16 MED ORDER — ACETAMINOPHEN 500 MG PO TABS
500.0000 mg | ORAL_TABLET | Freq: Four times a day (QID) | ORAL | Status: DC | PRN
  Administered 2015-08-16 – 2015-08-24 (×3): 500 mg via ORAL
  Filled 2015-08-16 (×3): qty 1

## 2015-08-16 MED ORDER — LEVOFLOXACIN IN D5W 750 MG/150ML IV SOLN: 750.0000 mg | INTRAVENOUS | Status: DC

## 2015-08-16 MED ORDER — METRONIDAZOLE IN NACL 5-0.79 MG/ML-% IV SOLN
500.0000 mg | Freq: Three times a day (TID) | INTRAVENOUS | Status: DC
  Administered 2015-08-16 – 2015-08-23 (×21): 500 mg via INTRAVENOUS
  Filled 2015-08-16 (×23): qty 100

## 2015-08-16 MED ORDER — METOPROLOL TARTRATE 12.5 MG HALF TABLET
12.5000 mg | ORAL_TABLET | Freq: Once | ORAL | Status: AC
  Administered 2015-08-16: 12.5 mg via ORAL

## 2015-08-16 MED ORDER — REGADENOSON 0.4 MG/5ML IV SOLN
INTRAVENOUS | Status: AC
  Filled 2015-08-16: qty 5

## 2015-08-16 MED ORDER — REGADENOSON 0.4 MG/5ML IV SOLN
0.4000 mg | Freq: Once | INTRAVENOUS | Status: DC
  Filled 2015-08-16: qty 5

## 2015-08-16 MED ORDER — IPRATROPIUM-ALBUTEROL 0.5-2.5 (3) MG/3ML IN SOLN
3.0000 mL | Freq: Four times a day (QID) | RESPIRATORY_TRACT | Status: DC
  Administered 2015-08-16 – 2015-08-25 (×32): 3 mL via RESPIRATORY_TRACT
  Filled 2015-08-16 (×34): qty 3

## 2015-08-16 NOTE — Progress Notes (Signed)
Family Medicine Teaching Service Daily Progress Note Intern Pager: [phone]  Patient name: Micheal Orozco Medical record number: [id number] Date of birth: 09-27-1929 Age: 82 y.o. Gender: male  Primary Care Provider: No primary care provider on file. Consultants: Vascular Surgery Code Status: FULL   Pt Overview and Major Events to Date:  3/7: Patient admitted for hematochezia   Assessment and Plan: [name] is a 82 y.o. male presenting with hematochezia 1 day. PMH is significant for COPD, CAD, H/o CABG, hypertension, glaucoma, macular degeneration.  # Hematochezia, likely secondary to colitis: Patient reports 1 day history of hematochezia. He reports substantial constipation over the past couple days. He was disimpacted earlier today at [facility] center. Now having BRBPR. Never experienced this before. CT showed evidence of sigmoidal colitis. Differential includes infectious versus inflammatory versus ischemia (less likely due to no association with increased pain with food) versus hemorrhoidal (unlikely secondary to CT findings).  - Admit under telemetry family medicine teaching service; attending Dr. Lum Babe - Patient is to be kept NPO - Enteric precautions - IV Levaquin and Flagyl per pharmacy initiated in ED >> continue - GI panel pending - Trend lactic acid: 2.1 >> 0.9  - IV fluids: D5 1/2 NS at 125 cc/hr  - Lipid panel: Cholesterol 134, Triglycerides 88, HDL 44, LDL 72  - Monitor CBCs for acute blood-loss anemia: HgB 9.8 >> 8.4  -Type and Cross blood  - Consult GI in a.m.  # Abdominal aortic aneurysm: 9.7 cm diameter noted on CT. Vascular surgery consulted in ED - Vascular surgery likely to perform repair once colitis/hematochezia resolves. - Monitor  # Lactic acidosis, improving: Likely secondary to colitis - Trend lactic acids: 2.1 >> 0.9  - IV fluid replacement  # COPD: Patient requires supplemental O2 at home at 2L. Endorses some mild increase in shortness of  breath without chest pain or tightness. No increase of supplemental O2 required at this time. - Continue home medications: Dulera, Atrovent, albuterol. - Continue supplemental oxygen as needed  # Renal dysfunction: Unknown if this is CKD versus AKI as patient does not have a baseline noted in our chart, and last creatinine from [facility] was from 1995 (Cr 1.1). Cr improved from 1.94 to 1.41 with IVFs.  - Avoid nephrotoxic medications. - Will monitor creatinine   # Urinary retention: 1 day. Patient states he has never experienced this in the past. Bladder scan by nursing yielded >600 mL's. Differential includes BPH versus prostatitis versus drug-induced versus regional inflammation/edema secondary to colitis. - Foley catheter placed - We'll continue to monitor - Holding antihistamine medications (chlorpheniramine) - Reassess in AM  # CAD, s/p CABG: Denies chest pain at this time. Physical exam yielded palpable peripheral pulses however capillary refill suggests underlying/undiagnosed PAD. - Cycle troponins: 0.08 >> 0.08 >> 0.09  - EKG: Some lateral T-wave abnormalities. Unfortunately no previous EKG to compare with. - Consider cardiology consultation if troponins elevate - Continue home dose of metoprolol, and pravastatin  # Hypertension - Metoprolol  # Glaucoma (right eye) and bilateral macular degeneration: s/p aqueous shunt placement. Blind in right eye.  - Continue home ophthalmic treatment: Atropine ophthalmic solution, Liquifilm Tears ophthalmic solution.  # Skin lesions: s/p skin biopsies of ears and nose by dermatology at [facility] on 08/02/15   FEN/GI: D5 half-normal saline at 125 mL/hour; NPO Prophylaxis: SCDs  Disposition: Pending repair of AAA and improvement in hematochezia    Subjective:  Reports no abdominal pain. Has had BMs since admission but  does not believe there has been any blood in his stool. Per nursing, he has had some mild streaking  in stools but no frank blood.   Objective: Temp:  [98.3 F (36.8 C)-99 F (37.2 C)] 99 F (37.2 C) (03/08 0529) Pulse Rate:  [35-130] 97 (03/08 0529) Resp:  [17-27] 18 (03/08 0529) BP: (95-155)/(66-119) 95/66 mmHg (03/08 0529) SpO2:  [89 %-100 %] 96 % (03/08 0529) FiO2 (%):  [21 %] 21 % (03/07 2130) Weight:  [152 lb (68.947 kg)] 152 lb (68.947 kg) (03/07 1400) Physical Exam: General: elderly male lying in bed in NAD  Cardiovascular: Tachycardic regular rhythm. Distant heart sounds. No murmur appreciated.  Respiratory: Occasional wheeze bilaterally. Normal WOB.  Abdomen: soft, NTND, large pulsatile mass palpable, +BS  Extremities: SCDs in place. Pedal pulses intact and equal.   Laboratory:  Recent Labs Lab 08/24/2015 1610 08/21/2015 1619 08/22/2015 2240 08/30/2015 0438  WBC 21.3*  --  16.7* 13.2*  HGB 10.9* 13.6 9.8* 8.4*  HCT 33.8* 40.0 30.7* 27.0*  PLT 121*  --  117* 104*    Recent Labs Lab 08/26/2015 1610 08/24/2015 1619 08/10/2015 0438  NA 134* 131* 133*  K 5.6* 5.3* 5.0  CL 91* 91* 97*  CO2 26  --  27  BUN 42* 44* 40*  CREATININE 1.94* 1.70* 1.41*  CALCIUM 10.0  --  8.7*  PROT  --   --  5.8*  BILITOT  --   --  0.9  ALKPHOS  --   --  58  ALT  --   --  21  AST  --   --  70*  GLUCOSE 148* 144* 137*     Imaging/Diagnostic Tests: Dg Chest 2 View  09/01/2015  CLINICAL DATA:  Shortness of Breath EXAM: CHEST  2 VIEW COMPARISON:  None. FINDINGS: Cardiomediastinal silhouette is unremarkable. Status post CABG. No infiltrate or pleural effusion. No pulmonary edema. Mild basilar scarring. Osteopenia and mild degenerative changes thoracic spine. Degenerative changes bilateral shoulders. IMPRESSION: No active disease. Status post CABG. Mild basilar scarring. Osteopenia and mild degenerative changes thoracic spine. Electronically Signed   By: Natasha Mead M.D.   On: 08/29/2015 15:53   Ct Angio Abd/pel W/ And/or W/o  08/12/2015  CLINICAL DATA:  Nausea.  Constipation. EXAM: CTA ABDOMEN  AND PELVIS wITHOUT AND WITH CONTRAST TECHNIQUE: Multidetector CT imaging of the abdomen and pelvis was performed using the standard protocol during bolus administration of intravenous contrast. Multiplanar reconstructed images and MIPs were obtained and reviewed to evaluate the vascular anatomy. CONTRAST:  80mL OMNIPAQUE IOHEXOL 350 MG/ML SOLN COMPARISON:  None. FINDINGS: There is a very large infrarenal abdominal aortic aneurysm. It is bilobed. Maximal AP and transverse diameters 9.7 and 10.1 cm respectively. The blood pool is poorly opacified within the aneurysm and iliac vasculature due to large volume. There is no extravasated contrast to suggest ruptured aneurysm. Mild narrowing of the celiac due to median arcuate ligament syndrome SMA is patent with mild atherosclerotic disease at its origin. Two right renal arteries and a single left renal artery are diminutive and patent. IMA origin is occluded.  Branches reconstitute Right common iliac artery aneurysm is 4.1 cm in caliber. Left common iliac artery aneurysm is 1.7 cm in caliber. External iliac arteries are poorly opacified. Diffuse atherosclerotic changes of the external iliac arteries are patent without obvious focal severe narrowing. Severe emphysema towards the lung bases is present. There are multi focal indeterminate pulmonary opacities at the lung base. 1.6 cm more central left  lower lobe parenchymal abnormality on image 5. There is a more patchy appearing opacity in the lingula measuring 17 mm on image 6. Tiny right lower lobe abnormality measures 3 mm on image 5. Patchy pulmonary opacities in the posterior and lateral left lung base on image 10 Diffuse hepatic steatosis. Gallbladder, spleen, pancreas, and adrenal glands are within normal limits. There is nonspecific perinephric stranding bilaterally worse on the left. There is significant wall thickening and wall edema involving the distal half of the descending colon, sigmoid colon, and rectum.  There are significant inflammatory changes in the perirectal fat. No pneumatosis. No extraluminal bowel gas. No sign of significant diverticulosis in this portion of the colon. Right inguinal hernia contains adipose tissue. Review of the MIP images confirms the above findings. IMPRESSION: Very large infrarenal abdominal aortic aneurysm with a maximal diameter of 10.1 cm. Right common iliac artery aneurysm with a maximal caliber a 4.1 cm There is extensive wall thickening of the distal colon in a continuous fashion beginning in the descending colon and extending to the rectum with associated inflammatory changes in the perirectal fat. Differential diagnosis includes inflammatory bowel disease, infectious colitis, pseudomembranous colitis, and ischemia. Electronically Signed   By: Jolaine ClickArthur  Hoss M.D.   On: 08/24/2015 17:26    Arvilla Marketatherine Lauren Eyonna Sandstrom, DO 08/31/2015, 7:23 AM PGY-1, [facility] FPTS Intern pager: [phone], text pages welcome

## 2015-08-16 NOTE — Progress Notes (Signed)
CALL PAGER [phone] for any questions or notifications regarding this patient   FMTS Attending Daily Note: Micheal LevySara Maggie Senseney MD Attending pager: [phone]  office [phone] Patient admitted overnight. I have discussed with Dr. Lum Babeeniola and team. I appreciate vascular surgery, cardiology and GI consults. We are placing large bore IV and have type and screened; I have asked nurse to try and get stool sample ASAP---she said he is not having any more than small smears of stool currently. He has fever at this time, will give tylenol and repeat blood cultures. Currently covering with levaquin and metronidazole. He is going to have myoview. Foley for urinary retention and have asked for it not to be removed without discussion with primary team. Blood pressure control is important so we are increasing his home dosage of beta blocker. His hemoglobin has trended downward and we have repeat check this afternoon.

## 2015-08-16 NOTE — Consult Note (Signed)
Referring Provider: Family practice teaching service (Dr. Denny Levy, attending) Primary Care Physician:  No primary care provider on file. Primary Gastroenterologist:  Gentry Fitz  Reason for Consultation:  Rectal bleeding, abnormal distal colonic appearance on CT  HPI: Micheal Orozco is a 82 y.o. male serving the wife sentence in prison, who states he had colonoscopy about 5 or 6 years ago, and was admitted through the emergency room late last night because of rectal bleeding following a digital disimpaction, performed because of constipation. An abdominal CT was performed which shows a massive abdominal aortic aneurysm, and some evidence for inflammation of the distal colon including the left colon and rectum. Since admission, the patient has had a roughly 2 g drop in hemoglobin although there has not been profuse or excessive bleeding, just small strands of blood with his stool.   Past Medical History  Diagnosis Date  . COPD (chronic obstructive pulmonary disease) (HCC)   . Renal insufficiency     Past Surgical History  Procedure Laterality Date  . Triple bypass  1995    Prior to Admission medications   Medication Sig Start Date End Date Taking? Authorizing Provider  acetaminophen (RA ACETAMINOPHEN) 650 MG CR tablet Take 650 mg by mouth every 8 (eight) hours as needed. Pain   Yes Historical Provider, MD  albuterol (PROVENTIL) (2.5 MG/3ML) 0.083% nebulizer solution Take 2.5 mg by nebulization every 6 (six) hours as needed for wheezing or shortness of breath.  08/08/09  Yes Historical Provider, MD  aspirin 81 MG tablet Take 81 mg by mouth daily. 08/08/09  Yes Historical Provider, MD  atropine 1 % ophthalmic solution Place 1 drop into the right eye 2 (two) times daily.   Yes Historical Provider, MD  B Complex Vitamins (VITAMIN B-COMPLEX PO) Take 1 tablet by mouth daily.   Yes Historical Provider, MD  chlorpheniramine (CHLOR-TRIMETON) 4 MG tablet Take 4 mg by mouth 3 (three) times daily.   Yes  Historical Provider, MD  gabapentin (NEURONTIN) 400 MG capsule Take 400 mg by mouth 3 (three) times daily.   Yes Historical Provider, MD  hydroxypropyl methylcellulose / hypromellose (ISOPTO TEARS / GONIOVISC) 2.5 % ophthalmic solution Place 1 drop into both eyes 4 (four) times daily as needed for dry eyes.   Yes Historical Provider, MD  ipratropium (ATROVENT HFA) 17 MCG/ACT inhaler Inhale 2 puffs into the lungs every 6 (six) hours as needed for wheezing.  08/08/09  Yes Historical Provider, MD  levalbuterol Pauline Aus) 0.63 MG/3ML nebulizer solution Take 0.63 mg by nebulization every 4 (four) hours as needed for wheezing or shortness of breath.   Yes Historical Provider, MD  lovastatin (MEVACOR) 40 MG tablet Take 40 mg by mouth at bedtime. 08/08/09  Yes Historical Provider, MD  metoprolol tartrate (LOPRESSOR) 25 MG tablet Take 25 mg by mouth 2 (two) times daily. 08/08/09  Yes Historical Provider, MD  mometasone-formoterol (DULERA) 100-5 MCG/ACT AERO Inhale 2 puffs into the lungs 2 (two) times daily.   Yes Historical Provider, MD  polycarbophil (FIBERCON) 625 MG tablet Take 1,250 mg by mouth 3 (three) times daily.   Yes Historical Provider, MD  ranitidine (ZANTAC) 300 MG tablet Take 300 mg by mouth 2 (two) times daily. 08/08/09  Yes Historical Provider, MD    Current Facility-Administered Medications  Medication Dose Route Frequency Provider Last Rate Last Dose  . acetaminophen (TYLENOL) tablet 500 mg  500 mg Oral Q6H PRN Nestor Ramp, MD      . albuterol (PROVENTIL) (2.5 MG/3ML) 0.083% nebulizer  solution 2.5 mg  2.5 mg Nebulization Q6H PRN Kathee DeltonIan D McKeag, MD      . albuterol (PROVENTIL) (2.5 MG/3ML) 0.083% nebulizer solution 2.5 mg  2.5 mg Nebulization QID Kathee DeltonIan D McKeag, MD   2.5 mg at 08/13/2015 1302  . atropine 1 % ophthalmic solution 1 drop  1 drop Right Eye BID Kathee DeltonIan D McKeag, MD   1 drop at 08/24/2015 1100  . dextrose 5 %-0.45 % sodium chloride infusion   Intravenous Continuous Kathee DeltonIan D McKeag, MD 125 mL/hr at  09/06/2015 2257    . gabapentin (NEURONTIN) capsule 400 mg  400 mg Oral TID Kathee DeltonIan D McKeag, MD   400 mg at 09/02/2015 1100  . ipratropium (ATROVENT) nebulizer solution 0.5 mg  0.5 mg Nebulization QID Doreene ElandKehinde T Eniola, MD   0.5 mg at 08/28/2015 1302  . [START ON 08/30/2015] levofloxacin (LEVAQUIN) IVPB 750 mg  750 mg Intravenous Q48H Titus Mouldaron G Amend, RPH      . metoprolol tartrate (LOPRESSOR) tablet 25 mg  25 mg Oral BID Casey BurkittHillary Moen Fitzgerald, MD      . metroNIDAZOLE (FLAGYL) IVPB 500 mg  500 mg Intravenous 3 times per day Titus MouldCaron G Amend, RPH   500 mg at 09/04/2015 0408  . mometasone-formoterol (DULERA) 100-5 MCG/ACT inhaler 2 puff  2 puff Inhalation BID Kathee DeltonIan D McKeag, MD   2 puff at 08/30/2015 0935  . polyvinyl alcohol (LIQUIFILM TEARS) 1.4 % ophthalmic solution 1 drop  1 drop Both Eyes QID PRN Doreene ElandKehinde T Eniola, MD      . pravastatin (PRAVACHOL) tablet 40 mg  40 mg Oral q1800 Kathee DeltonIan D McKeag, MD      . regadenoson [facility](LEXISCAN) injection SOLN 0.4 mg  0.4 mg Intravenous Once Bhavinkumar Bhagat, PA      . sodium chloride flush (NS) 0.9 % injection 3 mL  3 mL Intravenous Q12H Kathee DeltonIan D McKeag, MD   3 mL at 08/21/2015 1105    Allergies as of 08/30/2015 - Review Complete 08/29/2015  Allergen Reaction Noted  . Penicillins  08/14/2015  . Sulindac  08/13/2015    No family history on file.  Social History   Social History  . Marital Status: Single    Spouse Name: N/A  . Number of Children: N/A  . Years of Education: N/A   Occupational History  . Not on file.   Social History Main Topics  . Smoking status: Former Smoker    Types: Cigarettes    Quit date: 08/14/1993  . Smokeless tobacco: Not on file  . Alcohol Use: No  . Drug Use: No  . Sexual Activity: Not on file   Other Topics Concern  . Not on file   Social History Narrative    Review of Systems: Per history of present illness. No current chest pain. No diarrhea.  Physical Exam: Vital signs in last 24 hours: Temp:  [98.3 F (36.8 C)-101.2 F (38.4  C)] 101.2 F (38.4 C) (03/08 1305) Pulse Rate:  [35-114] 87 (03/08 1305) Resp:  [17-27] 19 (03/08 1305) BP: (95-155)/(66-119) 115/80 mmHg (03/08 1305) SpO2:  [89 %-100 %] 97 % (03/08 1305) FiO2 (%):  [21 %] 21 % (03/07 2130) Last BM Date: 08/11/2015 General:   Alert,  Well-developed, well-nourished, pleasant and cooperative in NAD Head:  Normocephalic and atraumatic. Eyes:  Right eye enucleated. Conjunctiva pink. Mouth:   No ulcerations or lesions.  Oropharynx pink & moist. Neck:   No masses or thyromegaly. Lungs:  Soft bilateral expiratory wheezes anteriorly, but no  rest for a distress, no rales. Heart:   Regular rate and rhythm; no murmurs, clicks, rubs,  or gallops. Abdomen:  Soft, nontender, nontympanitic, and nondistended. No masses, hepatosplenomegaly or ventral hernias noted. Normal bowel sounds, without bruits, guarding, or rebound.  Despite his history of a large aneurysm, I'm not able to feel a pulsatile mass. Msk:   Symmetrical without gross deformities. Extremities:   Without clubbing, cyanosis, or edema. Neurologic:  Alert and coherent;  grossly normal neurologically. Skin:  Intact without significant lesions or rashes. Cervical Nodes:  No significant cervical adenopathy. Psych:   Alert and cooperative. Normal mood and affect.  Intake/Output from previous day: 03/07 0701 - 03/08 0700 In: 1000 [I.V.:1000] Out: 800 [Urine:800] Intake/Output this shift: Total I/O In: 0  Out: 550 [Urine:550]  Lab Results:  Recent Labs  09/08/2015 2240 09/07/2015 0438 09/02/2015 1032  WBC 16.7* 13.2* 11.9*  HGB 9.8* 8.4* 8.9*  HCT 30.7* 27.0* 27.6*  PLT 117* 104* 92*   BMET  Recent Labs  08/13/2015 1610 08/28/2015 1619 08/09/2015 0438  NA 134* 131* 133*  K 5.6* 5.3* 5.0  CL 91* 91* 97*  CO2 26  --  27  GLUCOSE 148* 144* 137*  BUN 42* 44* 40*  CREATININE 1.94* 1.70* 1.41*  CALCIUM 10.0  --  8.7*   LFT  Recent Labs  08/11/2015 0438  PROT 5.8*  ALBUMIN 2.2*  AST 70*  ALT 21   ALKPHOS 58  BILITOT 0.9   PT/INR  Recent Labs  09/05/2015 2155  LABPROT 17.3*  INR 1.40    Studies/Results: Dg Chest 2 View  08/27/2015  CLINICAL DATA:  Shortness of Breath EXAM: CHEST  2 VIEW COMPARISON:  None. FINDINGS: Cardiomediastinal silhouette is unremarkable. Status post CABG. No infiltrate or pleural effusion. No pulmonary edema. Mild basilar scarring. Osteopenia and mild degenerative changes thoracic spine. Degenerative changes bilateral shoulders. IMPRESSION: No active disease. Status post CABG. Mild basilar scarring. Osteopenia and mild degenerative changes thoracic spine. Electronically Signed   By: Natasha Mead M.D.   On: 08/24/2015 15:53   Ct Angio Abd/pel W/ And/or W/o  08/31/2015  CLINICAL DATA:  Nausea.  Constipation. EXAM: CTA ABDOMEN AND PELVIS wITHOUT AND WITH CONTRAST TECHNIQUE: Multidetector CT imaging of the abdomen and pelvis was performed using the standard protocol during bolus administration of intravenous contrast. Multiplanar reconstructed images and MIPs were obtained and reviewed to evaluate the vascular anatomy. CONTRAST:  80mL OMNIPAQUE IOHEXOL 350 MG/ML SOLN COMPARISON:  None. FINDINGS: There is a very large infrarenal abdominal aortic aneurysm. It is bilobed. Maximal AP and transverse diameters 9.7 and 10.1 cm respectively. The blood pool is poorly opacified within the aneurysm and iliac vasculature due to large volume. There is no extravasated contrast to suggest ruptured aneurysm. Mild narrowing of the celiac due to median arcuate ligament syndrome SMA is patent with mild atherosclerotic disease at its origin. Two right renal arteries and a single left renal artery are diminutive and patent. IMA origin is occluded.  Branches reconstitute Right common iliac artery aneurysm is 4.1 cm in caliber. Left common iliac artery aneurysm is 1.7 cm in caliber. External iliac arteries are poorly opacified. Diffuse atherosclerotic changes of the external iliac arteries are patent  without obvious focal severe narrowing. Severe emphysema towards the lung bases is present. There are multi focal indeterminate pulmonary opacities at the lung base. 1.6 cm more central left lower lobe parenchymal abnormality on image 5. There is a more patchy appearing opacity in the lingula measuring 17  mm on image 6. Tiny right lower lobe abnormality measures 3 mm on image 5. Patchy pulmonary opacities in the posterior and lateral left lung base on image 10 Diffuse hepatic steatosis. Gallbladder, spleen, pancreas, and adrenal glands are within normal limits. There is nonspecific perinephric stranding bilaterally worse on the left. There is significant wall thickening and wall edema involving the distal half of the descending colon, sigmoid colon, and rectum. There are significant inflammatory changes in the perirectal fat. No pneumatosis. No extraluminal bowel gas. No sign of significant diverticulosis in this portion of the colon. Right inguinal hernia contains adipose tissue. Review of the MIP images confirms the above findings. IMPRESSION: Very large infrarenal abdominal aortic aneurysm with a maximal diameter of 10.1 cm. Right common iliac artery aneurysm with a maximal caliber a 4.1 cm There is extensive wall thickening of the distal colon in a continuous fashion beginning in the descending colon and extending to the rectum with associated inflammatory changes in the perirectal fat. Differential diagnosis includes inflammatory bowel disease, infectious colitis, pseudomembranous colitis, and ischemia. Electronically Signed   By: Jolaine Click M.D.   On: 08/18/2015 17:26    Impression: 1. Recent constipation with digital disimpaction at [facility] yesterday 2. Small volume hematochezia, which could be mucosal in origin from stercoral ulceration, colitis, or trauma from digital disimpaction yesterday. 3. Mild drop in hemoglobin, probably partly due to intestinal blood loss and partly due to  dilution. Note that he had a spurious transient rise in hemoglobin following admission, making the current hemoglobin appear to constitute a larger drop than is actually the case. 4. Radiographic abnormality of the colon. Differential diagnosis would include radiographic artifact, proctosigmoiditis from inflammatory bowel disease (doubt, his age, and given absence of diarrhea), or ischemic colitis  Plan: Unprepped, unsedated flexible sigmoidoscopy. The nature, purpose, and risks of sigmoidoscopic evaluation were reviewed with the patient and he is agreeable to proceed.   Our plan had been to do that this afternoon, but in the meantime, the patient has been taken down for a cardiac stress test. Therefore, the patient is not available for [place] to do the exam at this time, so we will plan to do it early tomorrow morning. If he should have profuse or destabilizing bleeding during the nighttime, his exam could of course be done as an on-call procedure.    LOS: 1 day   Vitali Seibert V  08/14/2015, 2:22 PM   Pager [phone] If no answer or after 5 PM call [phone]

## 2015-08-16 NOTE — Progress Notes (Signed)
Myoview completed. Images pending.  Corine ShelterLUKE Audriella Blakeley PA-C 09/05/2015 3:04 PM

## 2015-08-16 NOTE — Progress Notes (Signed)
    Subjective  - HD #2  No pain this am   Physical Exam:  Abdomen is less tender today Aneurysm is non-tender       Assessment/Plan:   Cardiac clearance pending.  Will plan for surgical repair of AAA this admission once colitis has resolved likely next week   Micheal Orozco, Wells 08/21/2015 3:22 PM --  Filed Vitals:   08/15/2015 1501 09/01/2015 1503  BP: 95/62 103/57  Pulse: 114 111  Temp:    Resp:      Intake/Output Summary (Last 24 hours) at 08/11/2015 1522 Last data filed at 08/14/2015 1230  Gross per 24 hour  Intake   1000 ml  Output   1350 ml  Net   -350 ml     Laboratory CBC    Component Value Date/Time   WBC 11.9* 08/18/2015 1032   HGB 8.9* 08/27/2015 1032   HCT 27.6* 08/29/2015 1032   PLT 92* 09/03/2015 1032    BMET    Component Value Date/Time   NA 133* 09/01/2015 0438   K 5.0 09/05/2015 0438   CL 97* 08/29/2015 0438   CO2 27 09/01/2015 0438   GLUCOSE 137* 08/26/2015 0438   BUN 40* 09/08/2015 0438   CREATININE 1.41* 08/21/2015 0438   CALCIUM 8.7* 08/24/2015 0438   GFRNONAA 44* 09/05/2015 0438   GFRAA 52* 08/24/2015 0438    COAG Lab Results  Component Value Date   INR 1.40 21-Nov-2015   No results found for: PTT  Antibiotics Anti-infectives    Start     Dose/Rate Route Frequency Ordered Stop   08/30/2015 1800  levofloxacin (LEVAQUIN) IVPB 750 mg     750 mg 100 mL/hr over 90 Minutes Intravenous Every 48 hours 09/07/2015 0125     08/15/2015 0400  metroNIDAZOLE (FLAGYL) IVPB 500 mg     500 mg 100 mL/hr over 60 Minutes Intravenous 3 times per day 08/24/2015 0125     11-24-15 1830  metroNIDAZOLE (FLAGYL) IVPB 500 mg  Status:  Discontinued     500 mg 100 mL/hr over 60 Minutes Intravenous Every 8 hours 11-24-15 1821 11-24-15 2133   11-24-15 1830  levofloxacin (LEVAQUIN) IVPB 750 mg  Status:  Discontinued     750 mg 100 mL/hr over 90 Minutes Intravenous Every 48 hours 11-24-15 1823 11-24-15 2133   11-24-15 1745  ciprofloxacin (CIPRO) IVPB 400 mg   Status:  Discontinued     400 mg 200 mL/hr over 60 Minutes Intravenous  Once 11-24-15 1732 11-24-15 1823   11-24-15 1745  metroNIDAZOLE (FLAGYL) IVPB 500 mg  Status:  Discontinued     500 mg 100 mL/hr over 60 Minutes Intravenous  Once 11-24-15 1732 11-24-15 1821   11-24-15 1745  piperacillin-tazobactam (ZOSYN) IVPB 3.375 g  Status:  Discontinued     3.375 g 100 mL/hr over 30 Minutes Intravenous  Once 11-24-15 1737 11-24-15 1737   11-24-15 1745  levofloxacin (LEVAQUIN) IVPB 750 mg  Status:  Discontinued     750 mg 100 mL/hr over 90 Minutes Intravenous  Once 11-24-15 1738 11-24-15 1811   11-24-15 1745  metroNIDAZOLE (FLAGYL) IVPB 500 mg  Status:  Discontinued     500 mg 100 mL/hr over 60 Minutes Intravenous  Once 11-24-15 1738 11-24-15 1812       V. Charlena CrossWells Vashti Bolanos IV, M.D. [facility] [place] Office: [phone] Pager:  [phone]

## 2015-08-16 NOTE — Consult Note (Signed)
CARDIOLOGY CONSULT NOTE   Patient ID: Micheal Orozco MRN: [id number] DOB/AGE: [birth year] 82 y.o.  Admit date: 08/24/2015  Primary Physician   No primary care provider on file. Primary Cardiologist  [facility] ? Reason for Consultation  Preop Clearance Requesting Physician  Dr. Jennette Kettle  HPI: Micheal Orozco is a 82 y.o. male with a history of CAD s/p CABG 1995 (unable to find records), COPD, HTN glaucoma, macular degeneration and HL who initially brought to [facility] from [place] for evaluation of constipation with nausea and blood in his stool. At [facility] center he had BRBPR and brought to [facility] for further evaluation. CT showed evidence of sigmoidal colitis with large 10 cm infrarenal abdominal aortic aneurysm and a right common iliac aneurysm measuring approximately 4 cm. His abdominal pain is improved. Denies chest pain, sob, LE edema, dizziness, orthopnea or PND.   EKG showed normal sinus rhythm with TWI in lateral leads. No prior EKG to compare. Troponin flat trend 0.08->0.09->0.06. Scr of 1.14. Anemia 8.9.   Police officer at bedside provided some hx. He goes to [facility] for eye care. Unknown last seen by cardiology.    Past Medical History  Diagnosis Date  . COPD (chronic obstructive pulmonary disease) (HCC)   . Renal insufficiency      Past Surgical History  Procedure Laterality Date  . Triple bypass  1995    Allergies  Allergen Reactions  . Penicillins   . Sulindac     I have reviewed the patient's current medications . albuterol  2.5 mg Nebulization QID  . atropine  1 drop Right Eye BID  . gabapentin  400 mg Oral TID  . ipratropium  0.5 mg Nebulization QID  . [START ON 08/28/2015] levofloxacin (LEVAQUIN) IV  750 mg Intravenous Q48H  . metoprolol tartrate  25 mg Oral BID  . metronidazole  500 mg Intravenous 3 times per day  . mometasone-formoterol  2 puff Inhalation BID  . pravastatin  40 mg Oral q1800  . sodium chloride flush  3 mL Intravenous Q12H   .  dextrose 5 % and 0.45% NaCl 125 mL/hr at 09/02/2015 2257   albuterol, polyvinyl alcohol  Prior to Admission medications   Medication Sig Start Date End Date Taking? Authorizing Provider  acetaminophen (RA ACETAMINOPHEN) 650 MG CR tablet Take 650 mg by mouth every 8 (eight) hours as needed. Pain   Yes Historical Provider, MD  albuterol (PROVENTIL) (2.5 MG/3ML) 0.083% nebulizer solution Take 2.5 mg by nebulization every 6 (six) hours as needed for wheezing or shortness of breath.  08/08/09  Yes Historical Provider, MD  aspirin 81 MG tablet Take 81 mg by mouth daily. 08/08/09  Yes Historical Provider, MD  atropine 1 % ophthalmic solution Place 1 drop into the right eye 2 (two) times daily.   Yes Historical Provider, MD  B Complex Vitamins (VITAMIN B-COMPLEX PO) Take 1 tablet by mouth daily.   Yes Historical Provider, MD  chlorpheniramine (CHLOR-TRIMETON) 4 MG tablet Take 4 mg by mouth 3 (three) times daily.   Yes Historical Provider, MD  gabapentin (NEURONTIN) 400 MG capsule Take 400 mg by mouth 3 (three) times daily.   Yes Historical Provider, MD  hydroxypropyl methylcellulose / hypromellose (ISOPTO TEARS / GONIOVISC) 2.5 % ophthalmic solution Place 1 drop into both eyes 4 (four) times daily as needed for dry eyes.   Yes Historical Provider, MD  ipratropium (ATROVENT HFA) 17 MCG/ACT inhaler Inhale 2 puffs into the lungs every 6 (six) hours as needed for  wheezing.  08/08/09  Yes Historical Provider, MD  levalbuterol Pauline Aus(XOPENEX) 0.63 MG/3ML nebulizer solution Take 0.63 mg by nebulization every 4 (four) hours as needed for wheezing or shortness of breath.   Yes Historical Provider, MD  lovastatin (MEVACOR) 40 MG tablet Take 40 mg by mouth at bedtime. 08/08/09  Yes Historical Provider, MD  metoprolol tartrate (LOPRESSOR) 25 MG tablet Take 25 mg by mouth 2 (two) times daily. 08/08/09  Yes Historical Provider, MD  mometasone-formoterol (DULERA) 100-5 MCG/ACT AERO Inhale 2 puffs into the lungs 2 (two) times daily.   Yes  Historical Provider, MD  polycarbophil (FIBERCON) 625 MG tablet Take 1,250 mg by mouth 3 (three) times daily.   Yes Historical Provider, MD  ranitidine (ZANTAC) 300 MG tablet Take 300 mg by mouth 2 (two) times daily. 08/08/09  Yes Historical Provider, MD     Social History   Social History  . Marital Status: Single    Spouse Name: N/A  . Number of Children: N/A  . Years of Education: N/A   Occupational History  . Not on file.   Social History Main Topics  . Smoking status: Former Smoker    Types: Cigarettes    Quit date: 08/14/1993  . Smokeless tobacco: Not on file  . Alcohol Use: No  . Drug Use: No  . Sexual Activity: Not on file   Other Topics Concern  . Not on file   Social History Narrative    No family status information on file.   He is unable to provide any specific hx of cardiac history. He  "dont know".   ROS:  Full 14 point review of systems complete and found to be negative unless listed above.  Physical Exam: Blood pressure 95/66, pulse 97, temperature 99 F (37.2 C), temperature source Oral, resp. rate 18, height 5\' 8"  (1.727 m), weight 152 lb (68.947 kg), SpO2 98 %.  General: elderly  male in no acute distress. Laying in bed. Police office at bedside.  Head: Eyes PERRLA, No xanthomas. Normocephalic and atraumatic, oropharynx without edema or exudate.  Lungs: Resp regular and unlabored. Diffuse wheezing.  Heart: RRR no s3, s4, or murmurs..   Neck: No carotid bruits. No lymphadenopathy.  No JVD. Abdomen: Bowel sounds present, abdomen soft and non-tender without masses or hernias noted. Msk:  No spine or cva tenderness. No weakness, no joint deformities or effusions. Extremities: No clubbing, cyanosis or edema. DP/PT/Radials 2+ and equal bilaterally. Neuro: Alert and oriented X 3. No focal deficits noted. Psych:  Good affect, responds appropriately Skin: No rashes or lesions noted.  Labs:   Lab Results  Component Value Date   WBC 11.9* 07-31-15   HGB  8.9* 07-31-15   HCT 27.6* 07-31-15   MCV 88.2 07-31-15   PLT 92* 07-31-15    Recent Labs  09/07/2015 2155  INR 1.40    Recent Labs Lab February 06, 2016 0438  NA 133*  K 5.0  CL 97*  CO2 27  BUN 40*  CREATININE 1.41*  CALCIUM 8.7*  PROT 5.8*  BILITOT 0.9  ALKPHOS 58  ALT 21  AST 70*  GLUCOSE 137*  ALBUMIN 2.2*   No results found for: MG  Recent Labs  09/08/2015 1610 08/30/2015 2240 February 06, 2016 0438 February 06, 2016 1032  TROPONINI 0.08* 0.08* 0.09* 0.06*   No results for input(s): TROPIPOC in the last 72 hours. No results found for: PROBNP Lab Results  Component Value Date   CHOL 134 08/12/2015   HDL 44 08/22/2015   LDLCALC  72 09/08/2015   TRIG 88 09/05/2015   No results found for: DDIMER No results found for: LIPASE, AMYLASE TSH  Date/Time Value Ref Range Status  09/05/2015 09:55 PM 1.285 0.350 - 4.500 uIU/mL Final   No results found for: VITAMINB12, FOLATE, FERRITIN, TIBC, IRON, RETICCTPCT   ECG: Vent. rate 113 BPM PR interval 158 ms QRS duration 79 ms QT/QTc 343/470 ms P-R-T axes 81 49 -1  Radiology:  Dg Chest 2 View  08/12/2015  CLINICAL DATA:  Shortness of Breath EXAM: CHEST  2 VIEW COMPARISON:  None. FINDINGS: Cardiomediastinal silhouette is unremarkable. Status post CABG. No infiltrate or pleural effusion. No pulmonary edema. Mild basilar scarring. Osteopenia and mild degenerative changes thoracic spine. Degenerative changes bilateral shoulders. IMPRESSION: No active disease. Status post CABG. Mild basilar scarring. Osteopenia and mild degenerative changes thoracic spine. Electronically Signed   By: Natasha Mead M.D.   On: 08/24/2015 15:53   Ct Angio Abd/pel W/ And/or W/o  08/30/2015  CLINICAL DATA:  Nausea.  Constipation. EXAM: CTA ABDOMEN AND PELVIS wITHOUT AND WITH CONTRAST TECHNIQUE: Multidetector CT imaging of the abdomen and pelvis was performed using the standard protocol during bolus administration of intravenous contrast. Multiplanar reconstructed images  and MIPs were obtained and reviewed to evaluate the vascular anatomy. CONTRAST:  80mL OMNIPAQUE IOHEXOL 350 MG/ML SOLN COMPARISON:  None. FINDINGS: There is a very large infrarenal abdominal aortic aneurysm. It is bilobed. Maximal AP and transverse diameters 9.7 and 10.1 cm respectively. The blood pool is poorly opacified within the aneurysm and iliac vasculature due to large volume. There is no extravasated contrast to suggest ruptured aneurysm. Mild narrowing of the celiac due to median arcuate ligament syndrome SMA is patent with mild atherosclerotic disease at its origin. Two right renal arteries and a single left renal artery are diminutive and patent. IMA origin is occluded.  Branches reconstitute Right common iliac artery aneurysm is 4.1 cm in caliber. Left common iliac artery aneurysm is 1.7 cm in caliber. External iliac arteries are poorly opacified. Diffuse atherosclerotic changes of the external iliac arteries are patent without obvious focal severe narrowing. Severe emphysema towards the lung bases is present. There are multi focal indeterminate pulmonary opacities at the lung base. 1.6 cm more central left lower lobe parenchymal abnormality on image 5. There is a more patchy appearing opacity in the lingula measuring 17 mm on image 6. Tiny right lower lobe abnormality measures 3 mm on image 5. Patchy pulmonary opacities in the posterior and lateral left lung base on image 10 Diffuse hepatic steatosis. Gallbladder, spleen, pancreas, and adrenal glands are within normal limits. There is nonspecific perinephric stranding bilaterally worse on the left. There is significant wall thickening and wall edema involving the distal half of the descending colon, sigmoid colon, and rectum. There are significant inflammatory changes in the perirectal fat. No pneumatosis. No extraluminal bowel gas. No sign of significant diverticulosis in this portion of the colon. Right inguinal hernia contains adipose tissue. Review  of the MIP images confirms the above findings. IMPRESSION: Very large infrarenal abdominal aortic aneurysm with a maximal diameter of 10.1 cm. Right common iliac artery aneurysm with a maximal caliber a 4.1 cm There is extensive wall thickening of the distal colon in a continuous fashion beginning in the descending colon and extending to the rectum with associated inflammatory changes in the perirectal fat. Differential diagnosis includes inflammatory bowel disease, infectious colitis, pseudomembranous colitis, and ischemia. Electronically Signed   By: Jolaine Click M.D.   On: 08/22/2015  17:26    ASSESSMENT AND PLAN:    Micheal Orozco is a 82 y.o. male with a history of CAD s/p CABG 1995 (unable to find records), COPD, HTN glaucoma, macular degeneration and HL who consulted for preop clearance.     AAA (abdominal aortic aneurysm) (HCC)   Colitis, acute   Abdominal aortic aneurysm (AAA) without rupture (HCC)   Abdominal pain   Renal insufficiency   Urinary retention   Bleeding gastrointestinal   Elevated troponin   Coronary artery disease involving coronary bypass graft of native heart with unspecified angina pectoris   Hyponatremia   Hyperkalemia  Unable to find hx of CABG. No documentation at [facility]. The patient unable to provide any specific info. Now here with abdominal bleeding, likely colitis with anemia. 9.7 cm diameter AAA noted on CT. Given EKG changes and flat trend troponin (likely due to elevated troponin and anemia) will get Lexiscan Myoview. MD to see.   SignedManson Passey, PA 09-09-2015, 12:19 PM Pager [phone]  Co-Sign MD

## 2015-08-17 ENCOUNTER — Encounter (HOSPITAL_COMMUNITY): Admission: EM | Disposition: E | Payer: Self-pay | Source: Home / Self Care | Attending: Family Medicine

## 2015-08-17 ENCOUNTER — Encounter (HOSPITAL_COMMUNITY): Payer: Self-pay | Admitting: *Deleted

## 2015-08-17 DIAGNOSIS — K55039 Acute (reversible) ischemia of large intestine, extent unspecified: Secondary | ICD-10-CM | POA: Diagnosis present

## 2015-08-17 HISTORY — PX: FLEXIBLE SIGMOIDOSCOPY: SHX5431

## 2015-08-17 LAB — MRSA PCR SCREENING: MRSA BY PCR: POSITIVE — AB

## 2015-08-17 LAB — CBC
HCT: 26.8 % — ABNORMAL LOW (ref 39.0–52.0)
Hemoglobin: 8.6 g/dL — ABNORMAL LOW (ref 13.0–17.0)
MCH: 28.3 pg (ref 26.0–34.0)
MCHC: 32.1 g/dL (ref 30.0–36.0)
MCV: 88.2 fL (ref 78.0–100.0)
PLATELETS: 96 10*3/uL — AB (ref 150–400)
RBC: 3.04 MIL/uL — AB (ref 4.22–5.81)
RDW: 13.8 % (ref 11.5–15.5)
WBC: 10.3 10*3/uL (ref 4.0–10.5)

## 2015-08-17 LAB — BASIC METABOLIC PANEL
ANION GAP: 8 (ref 5–15)
BUN: 21 mg/dL — ABNORMAL HIGH (ref 6–20)
CALCIUM: 8.2 mg/dL — AB (ref 8.9–10.3)
CO2: 25 mmol/L (ref 22–32)
Chloride: 96 mmol/L — ABNORMAL LOW (ref 101–111)
Creatinine, Ser: 1 mg/dL (ref 0.61–1.24)
Glucose, Bld: 134 mg/dL — ABNORMAL HIGH (ref 65–99)
POTASSIUM: 4 mmol/L (ref 3.5–5.1)
SODIUM: 129 mmol/L — AB (ref 135–145)

## 2015-08-17 LAB — GASTROINTESTINAL PANEL BY PCR, STOOL (REPLACES STOOL CULTURE)

## 2015-08-17 LAB — HEMOGLOBIN A1C
Hgb A1c MFr Bld: 5.9 % — ABNORMAL HIGH (ref 4.8–5.6)
Mean Plasma Glucose: 123 mg/dL

## 2015-08-17 SURGERY — SIGMOIDOSCOPY, FLEXIBLE
Anesthesia: Moderate Sedation

## 2015-08-17 MED ORDER — METOPROLOL TARTRATE 50 MG PO TABS
50.0000 mg | ORAL_TABLET | Freq: Two times a day (BID) | ORAL | Status: DC
  Administered 2015-08-17 – 2015-08-18 (×2): 50 mg via ORAL
  Filled 2015-08-17 (×2): qty 1

## 2015-08-17 MED ORDER — DEXTROSE-NACL 5-0.9 % IV SOLN
INTRAVENOUS | Status: AC
  Administered 2015-08-17 – 2015-08-19 (×4): via INTRAVENOUS
  Filled 2015-08-17 (×8): qty 1000

## 2015-08-17 MED ORDER — SACCHAROMYCES BOULARDII 250 MG PO CAPS
250.0000 mg | ORAL_CAPSULE | Freq: Two times a day (BID) | ORAL | Status: DC
  Administered 2015-08-17 – 2015-08-24 (×14): 250 mg via ORAL
  Filled 2015-08-17 (×21): qty 1

## 2015-08-17 MED ORDER — MIDAZOLAM HCL 5 MG/ML IJ SOLN
INTRAMUSCULAR | Status: AC
  Filled 2015-08-17: qty 1

## 2015-08-17 MED ORDER — CETYLPYRIDINIUM CHLORIDE 0.05 % MT LIQD
7.0000 mL | Freq: Two times a day (BID) | OROMUCOSAL | Status: DC
  Administered 2015-08-17 – 2015-08-24 (×15): 7 mL via OROMUCOSAL

## 2015-08-17 MED ORDER — FENTANYL CITRATE (PF) 100 MCG/2ML IJ SOLN
INTRAMUSCULAR | Status: AC
  Filled 2015-08-17: qty 2

## 2015-08-17 MED ORDER — METOPROLOL TARTRATE 25 MG PO TABS: 25.0000 mg | ORAL_TABLET | Freq: Once | ORAL | Status: DC

## 2015-08-17 MED ORDER — CHLORHEXIDINE GLUCONATE CLOTH 2 % EX PADS
6.0000 | MEDICATED_PAD | Freq: Every day | CUTANEOUS | Status: AC
  Administered 2015-08-17 – 2015-08-21 (×5): 6 via TOPICAL

## 2015-08-17 MED ORDER — LEVOFLOXACIN IN D5W 500 MG/100ML IV SOLN
500.0000 mg | INTRAVENOUS | Status: DC
  Administered 2015-08-17: 500 mg via INTRAVENOUS
  Filled 2015-08-17 (×2): qty 100

## 2015-08-17 MED ORDER — MUPIROCIN 2 % EX OINT
1.0000 "application " | TOPICAL_OINTMENT | Freq: Two times a day (BID) | CUTANEOUS | Status: AC
  Administered 2015-08-17 – 2015-08-21 (×10): 1 via NASAL
  Filled 2015-08-17 (×3): qty 22

## 2015-08-17 NOTE — Progress Notes (Addendum)
Patient arrived to 3S09 via bed.  Belongings placed in closet.  Patient hooked up to heart monitor, Elink and CCMD notified of arrival.  Patient is calm and VSS.  Pt denies pain or discomfort.  Reporting RN states that Foley care has been preformed this evening already.  Patient oriented to room and call bell placed within reach.  Guard is present at patient's bedside.  Will continue to monitor.

## 2015-08-17 NOTE — Progress Notes (Signed)
ANTIBIOTIC CONSULT NOTE - INITIAL  Pharmacy Consult for Flagyl, Levaquin Indication: Intraabdominal infection  Allergies  Allergen Reactions  . Penicillins   . Sulindac     Patient Measurements: Height: 5\' 7"  (170.2 cm) Weight: 156 lb 1.4 oz (70.8 kg) IBW/kg (Calculated) : 66.1  Vital Signs: Temp: 101.2 F (38.4 C) (03/09 1229) Temp Source: Oral (03/09 1229) BP: 98/62 mmHg (03/09 1229) Pulse Rate: 85 (03/09 1229) Intake/Output from previous day: 03/08 0701 - 03/09 0700 In: 4406.3 [I.V.:4006.3; IV Piggyback:400] Out: 1100 [Urine:1100] Intake/Output from this shift: Total I/O In: -  Out: 300 [Urine:300]  Labs:  Recent Labs  08/13/2015 1619  10/07/15 0438  10/07/15 1700 10/07/15 2316 09/03/2015 0522  WBC  --   < > 13.2*  < > 11.8* 10.3 10.3  HGB 13.6  < > 8.4*  < > 8.8* 8.4* 8.6*  PLT  --   < > 104*  < > 94* 86* 96*  CREATININE 1.70*  --  1.41*  --   --   --  1.00  < > = values in this interval not displayed. Estimated Creatinine Clearance: 52.3 mL/min (by C-G formula based on Cr of 1). No results for input(s): VANCOTROUGH, VANCOPEAK, VANCORANDOM, GENTTROUGH, GENTPEAK, GENTRANDOM, TOBRATROUGH, TOBRAPEAK, TOBRARND, AMIKACINPEAK, AMIKACINTROU, AMIKACIN in the last 72 hours.   Microbiology:   Medical History: Past Medical History  Diagnosis Date  . COPD (chronic obstructive pulmonary disease) (HCC)   . Renal insufficiency     Assessment: 85 yom admitted 08/24/2015  with abdominal mass. Pharmacy consulted to dose levaquin and flagyl for intra-abdominal infection.  WBC 21.3, Tmax 98.6. CrCl ~6830mL/min  Infectious Disease: levo/flagyl D#3 for intra-abdominal infxn. Colitis on CT. Afeb, wbc 11.8>10.3 (down), LA trend down to 0.9. Paged FP about GI recommendations and they want to continue abx for now. Scr improved 1.4>1  3/7 levo>> 3/7 flagyl>>   Goal of Therapy:  Eradication of infection   Plan:  Flagyl 500mg  IV q8hr. No renal adjustment required Change  Levaquin to 500mg  IV q24h (for CrCl>50).  Pharmacy will sign off. Please reconsult for further dosing assitance.    Merilynn Finlandobertson, Levi StraussCrystal Stillinger 08/19/2015,1:47 PM

## 2015-08-17 NOTE — Progress Notes (Signed)
Patient tolerated his sigmoidoscopy well.  He has no evidence of fecal impaction at this time, nor of any rectal bleeding or blood in the colonic lumen.  He did have evidence of confluent colitis from the anal verge up to the mid descending colon, which was the limit of his exam. Please see dictated procedure note for details, but in essence, this looks like moderately severe ischemic colitis, which is probably in the resolving phase (which would explain the absence of blood at this time). The biopsies I took may help to confirm this diagnosis.  It is unusual for ischemic colitis to involve the rectum, raising the question as to whether that diagnosis is correct, or, alternatively, whether his atherosclerosis and/or large aortic aneurysm may be involving the blood supply to the rectum.  I think his "constipation" may actually have been due to tenesmus symptoms from his inflamed rectum.  For now, I would favor supportive care, because this did not look gangrenous and the patient is not clinically toxic, and therefore no surgical resection of the colon is needed.  The patient is on Levaquin, although I do not feel that antibiotics for this condition are specifically needed. You may want to consider prophylactic probiotic therapy with Saccharomyces, to diminish the risk of developing C. difficile infection.  We will follow at a distance with you. Please call if you have questions pertaining to this patient's care.  Florencia Reasonsobert V. Edie Darley, M.D. Pager [phone] If no answer or after 5 PM call [phone]

## 2015-08-17 NOTE — Progress Notes (Signed)
Family Medicine Teaching Service Daily Progress Note Intern Pager: [phone]  Patient name: Micheal Orozco Medical record number: [id number] Date of birth: 05-07-1930 Age: 82 y.o. Gender: male  Primary Care Provider: No primary care provider on file. Consultants: Vascular Surgery Code Status: FULL   Pt Overview and Major Events to Date:  3/7: Patient admitted for hematochezia   Assessment and Plan: Micheal Orozco is a 82 y.o. male presenting with hematochezia 1 day. PMH is significant for COPD, CAD, H/o CABG, hypertension, glaucoma, macular degeneration.  # Hematochezia, likely secondary to colitis: Patient reports 1 day history of hematochezia following digital disimpaction. CT showed evidence of sigmoidal colitis. Patient now with minimal streaking of blood in his stools. Treating for probable ischemic colitis, low suspicion for infectious colitis.  - Patient is to be kept NPO - Enteric precautions - IV Levaquin and Flagyl (3/7 >>)  - GI panel pending - IV fluids: switch to D5 NS at 125 cc/hr (Na low at 129) - Lipid panel: Cholesterol 134, Triglycerides 88, HDL 44, LDL 72  - Monitor CBCs for acute blood-loss anemia: HgB 9.8 >> 8.4 >> 8.6 -GI consulted, appreciate recs >> flexible sigmoidoscopy concerning for ischemic colitis  -will add probiotic today   # Abdominal aortic aneurysm: 9.7 cm diameter noted on CT.  - Vascular surgery consulted,  Plan  to perform repair once colitis resolves, would appreciate vascular re-considering timing of surgery given likely ischemic colitis  - cardiology consulted for surgical clearance  -Lexiscan myoview performed given h/o CAD: no T wave inversion or ST segment deviation during stress, LVEF 30-44%, intermediate risk study    # Lactic acidosis, Resolved: Likely secondary to colitis. Lactic acid 2.1 >> 0.9.   # COPD: Patient requires supplemental O2 at home at 2L. Endorses some mild increase in shortness of breath without chest pain or tightness. No  increase of supplemental O2 required at this time. - Continue home medications: Dulera, Atrovent, albuterol. - Continue supplemental oxygen as needed  # Renal dysfunction: Unknown if this is CKD versus AKI as patient does not have a baseline noted in our chart, and last creatinine from [facility] was from 1995 (Cr 1.1). Cr improved from 1.94 to 1.00 with IVFs.  - Avoid nephrotoxic medications. - Will monitor creatinine   # Urinary retention:  Patient states he has never experienced this in the past. Bladder scan by nursing yielded >600 mL's. Differential includes BPH versus prostatitis versus drug-induced versus regional inflammation/edema secondary to colitis. - Foley catheter placed, should not be removed w/o discussion with primary team  - We'll continue to monitor - Holding antihistamine medications (chlorpheniramine)   # CAD, s/p CABG: Denies chest pain at this time. Physical exam yielded palpable peripheral pulses however capillary refill suggests underlying/undiagnosed PAD. Found to have mildly elevated troponins with a flat trend. EKG with some lateral T-wave abnormalities with no previous EKG to compare with.  -cardiology consulted for pre-op clearance  - Continue metoprolol and pravastatin  # Hypertension - increased metoprolol to 50 mg BID as blood pressure control very important in this patient   # Glaucoma (right eye) and bilateral macular degeneration: s/p aqueous shunt placement. Blind in right eye.  - Continue home ophthalmic treatment: Atropine ophthalmic solution, Liquifilm Tears ophthalmic solution.  # Skin lesions: s/p skin biopsies of ears and nose by dermatology at [facility] on 08/02/15   FEN/GI: D5 NS at 125 mL/hour; NPO Prophylaxis: SCDs  Disposition: Pending repair of AAA and improvement in hematochezia  Subjective:  Feels fine after procedure. Denies active abdominal pain. Feels like his breathing is at baseline. No new respiratory complaints.    Objective: Temp:  [98.3 F (36.8 C)-101.2 F (38.4 C)] 100.3 F (37.9 C) (03/09 0557) Pulse Rate:  [87-114] 88 (03/09 0413) Resp:  [17-32] 20 (03/09 0413) BP: (95-124)/(56-80) 100/60 mmHg (03/09 0413) SpO2:  [90 %-99 %] 97 % (03/09 0729) Weight:  [156 lb 1.4 oz (70.8 kg)] 156 lb 1.4 oz (70.8 kg) (03/08 2110) Physical Exam: General: elderly male lying in bed in NAD  Cardiovascular: Tachycardic regular rhythm. Distant heart sounds. No murmur appreciated.  Respiratory: Occasional wheeze bilaterally. Normal WOB.  Abdomen: soft, NTND, large pulsatile mass palpable, +BS  Extremities: SCDs in place. Pedal pulses intact and equal.   Laboratory:  Recent Labs Lab 08/27/2015 1700 08/31/2015 2316 08/23/2015 0522  WBC 11.8* 10.3 10.3  HGB 8.8* 8.4* 8.6*  HCT 27.3* 25.5* 26.8*  PLT 94* 86* 96*    Recent Labs Lab 08/09/2015 1610 08/09/2015 1619 08/13/2015 0438 08/23/2015 0522  NA 134* 131* 133* 129*  K 5.6* 5.3* 5.0 4.0  CL 91* 91* 97* 96*  CO2 26  --  27 25  BUN 42* 44* 40* 21*  CREATININE 1.94* 1.70* 1.41* 1.00  CALCIUM 10.0  --  8.7* 8.2*  PROT  --   --  5.8*  --   BILITOT  --   --  0.9  --   ALKPHOS  --   --  58  --   ALT  --   --  21  --   AST  --   --  70*  --   GLUCOSE 148* 144* 137* 134*     Imaging/Diagnostic Tests: Dg Chest 2 View  08/22/2015  CLINICAL DATA:  Shortness of Breath EXAM: CHEST  2 VIEW COMPARISON:  None. FINDINGS: Cardiomediastinal silhouette is unremarkable. Status post CABG. No infiltrate or pleural effusion. No pulmonary edema. Mild basilar scarring. Osteopenia and mild degenerative changes thoracic spine. Degenerative changes bilateral shoulders. IMPRESSION: No active disease. Status post CABG. Mild basilar scarring. Osteopenia and mild degenerative changes thoracic spine. Electronically Signed   By: Natasha Mead M.D.   On: 08/31/2015 15:53   Nm Myocar Multi W/spect W/wall Motion / Ef  08/30/2015   No T wave inversion was noted during stress.  There was no ST  segment deviation noted during stress.  The left ventricular ejection fraction is moderately decreased (30-44%).  This is an intermediate risk study.  1. EF 37% with diffuse hypokinesis, looks worst in the septum. 2. There is a medium-sized, severe fixed inferior perfusion defect consistent with prior infarction.  There is a reversible small but severe apical anterior/apical septal perfusion defect that is suggestive of ischemia.  3. Intermediate risk study.   Ct Angio Abd/pel W/ And/or W/o  08/16/2015  CLINICAL DATA:  Nausea.  Constipation. EXAM: CTA ABDOMEN AND PELVIS wITHOUT AND WITH CONTRAST TECHNIQUE: Multidetector CT imaging of the abdomen and pelvis was performed using the standard protocol during bolus administration of intravenous contrast. Multiplanar reconstructed images and MIPs were obtained and reviewed to evaluate the vascular anatomy. CONTRAST:  80mL OMNIPAQUE IOHEXOL 350 MG/ML SOLN COMPARISON:  None. FINDINGS: There is a very large infrarenal abdominal aortic aneurysm. It is bilobed. Maximal AP and transverse diameters 9.7 and 10.1 cm respectively. The blood pool is poorly opacified within the aneurysm and iliac vasculature due to large volume. There is no extravasated contrast to suggest ruptured aneurysm. Mild narrowing of  the celiac due to median arcuate ligament syndrome SMA is patent with mild atherosclerotic disease at its origin. Two right renal arteries and a single left renal artery are diminutive and patent. IMA origin is occluded.  Branches reconstitute Right common iliac artery aneurysm is 4.1 cm in caliber. Left common iliac artery aneurysm is 1.7 cm in caliber. External iliac arteries are poorly opacified. Diffuse atherosclerotic changes of the external iliac arteries are patent without obvious focal severe narrowing. Severe emphysema towards the lung bases is present. There are multi focal indeterminate pulmonary opacities at the lung base. 1.6 cm more central left lower lobe  parenchymal abnormality on image 5. There is a more patchy appearing opacity in the lingula measuring 17 mm on image 6. Tiny right lower lobe abnormality measures 3 mm on image 5. Patchy pulmonary opacities in the posterior and lateral left lung base on image 10 Diffuse hepatic steatosis. Gallbladder, spleen, pancreas, and adrenal glands are within normal limits. There is nonspecific perinephric stranding bilaterally worse on the left. There is significant wall thickening and wall edema involving the distal half of the descending colon, sigmoid colon, and rectum. There are significant inflammatory changes in the perirectal fat. No pneumatosis. No extraluminal bowel gas. No sign of significant diverticulosis in this portion of the colon. Right inguinal hernia contains adipose tissue. Review of the MIP images confirms the above findings. IMPRESSION: Very large infrarenal abdominal aortic aneurysm with a maximal diameter of 10.1 cm. Right common iliac artery aneurysm with a maximal caliber a 4.1 cm There is extensive wall thickening of the distal colon in a continuous fashion beginning in the descending colon and extending to the rectum with associated inflammatory changes in the perirectal fat. Differential diagnosis includes inflammatory bowel disease, infectious colitis, pseudomembranous colitis, and ischemia. Electronically Signed   By: Jolaine ClickArthur  Hoss M.D.   On: 08/28/2015 17:26    Arvilla Marketatherine Lauren Wallace, DO 08/31/2015, 7:33 AM PGY-1, [facility] FPTS Intern pager: [phone], text pages welcome

## 2015-08-17 NOTE — Op Note (Signed)
[facility] Patient Name: Micheal Orozco Procedure Date : September 07, 2015 MRN: [id number] Attending MD: Bernette Redbird , MD Date of Birth: Jun 19, 1929 CSN: [id number] Age: 82 Admit Type: Inpatient Procedure:                Flexible Sigmoidoscopy Indications:              Rectal hemorrhage, Abnormal CT of the GI tract                            (distal colitis), in patient with very large AAA Providers:                Bernette Redbird, MD, Dow Adolph, RN, Kandice Robinsons, Technician Referring MD:              Medicines:                None Complications:            No immediate complications. Estimated Blood Loss:     Estimated blood loss was minimal. Procedure:                Pre-Anesthesia Assessment:                           - Prior to the procedure, a History and Physical                            was performed, and patient medications and                            allergies were reviewed. The patient's tolerance of                            previous anesthesia was also reviewed. The risks                            and benefits of the procedure and the sedation                            options and risks were discussed with the patient.                            All questions were answered, and informed consent                            was obtained. Prior Anticoagulants: The patient has                            taken aspirin, last dose was 1 day prior to                            procedure. ASA Grade Assessment: IV - A patient  with severe systemic disease that is a constant                            threat to life. After reviewing the risks and                            benefits, the patient was deemed in satisfactory                            condition to undergo the procedure.                           After obtaining informed consent, the scope was                            passed under direct vision.  The EC-[id number] ([id number])                            scope was introduced through the anus and advanced                            to the 40 cm from the anal verge. The flexible                            sigmoidoscopy was accomplished without difficulty.                            The patient tolerated the procedure well. The                            quality of the bowel preparation was adequate (done                            unprepped). Scope In: 9:01:52 AM Scope Out: 9:07:50 AM Total Procedure Duration: 0 hours 5 minutes 58 seconds  Findings:      The digital rectal exam findings include flat prostate bed.      There were changes of moderately severe colitis, suggestive of ischemic       colitis, from the anal verge up to the limit of the exam, characterized       by confluent inflammation, with diffuse areas of moderately eroded,       inflamed and vascular-pattern-decreased mucosa, found in the rectum, in       the sigmoid colon and in the descending colon. Biopsies were taken with       a cold forceps for histology.      There was no evidence of gangrene, although I did see one submucosal       bleb.      There was no significant friability, no hemorrhage, no blood in the       colonic lumen. No focal deep ulcerations or evidence of masses. Impression:               - Flat prostate bed found on digital rectal exam.                           -  Eroded, inflamed and vascular-pattern-decreased                            mucosa in the rectum, in the sigmoid colon and in                            the descending colon. Biopsied.                           - Overall picture suggestive of moderately severe,                            probably resolving, ischemic colitis. Rectal                            involvement is unusual for that condition, BUT                            patient's atherosclerosis and/or large AAA may be                            involving the hemorrhoidal arteries  that supply the                            rectum. Moderate Sedation:      Moderate (conscious) sedation was administered by the endoscopy nurse       and supervised by the endoscopist. The following parameters were       monitored: oxygen saturation, heart rate, blood pressure, and response       to care. Total physician intraservice time was 6 minutes. Recommendation:           - Await pathology results.                           - Continue present medications. Procedure Code(s):        --- Professional ---                           [id number], Sigmoidoscopy, flexible; with biopsy, single                            or multiple Diagnosis Code(s):        --- Professional ---                           K52.9, Noninfective gastroenteritis and colitis,                            unspecified                           K62.5, Hemorrhage of anus and rectum                           R93.3, Abnormal findings on diagnostic imaging of  other parts of digestive tract CPT copyright 2016 [employer]. All rights reserved. The codes documented in this report are preliminary and upon coder review may  be revised to meet current compliance requirements. Bernette Redbird, MD  Bernette Redbird, MD 2015/09/12 9:33:31 AM This report has been signed electronically. Number of Addenda: 0

## 2015-08-17 NOTE — Progress Notes (Signed)
Subjective  - HD#3  Does not complain of abdominal pain   Physical Exam:  abd soft with no pain Pulsatile abdominal mass which is non tender CV:  RRR Pulm:  Non labored respirations       Assessment/Plan:  Large AAA  Appreciate GI work up Abdominal pain improved with abx Will plan for AAA repair , likely next week, pending cardiology clearance  Brabham, Wells 08/24/2015 9:35 PM --  Filed Vitals:   09/06/2015 1800 08/15/2015 1932  BP: 111/69 112/65  Pulse: 86 99  Temp: 100.3 F (37.9 C) 100.1 F (37.8 C)  Resp: 18 18    Intake/Output Summary (Last 24 hours) at 08/12/2015 2135 Last data filed at 08/12/2015 1932  Gross per 24 hour  Intake 2066.67 ml  Output    975 ml  Net 1091.67 ml     Laboratory CBC    Component Value Date/Time   WBC 10.3 08/16/2015 0522   HGB 8.6* 08/29/2015 0522   HCT 26.8* 08/16/2015 0522   PLT 96* 08/10/2015 0522    BMET    Component Value Date/Time   NA 129* 08/24/2015 0522   K 4.0 08/16/2015 0522   CL 96* 09/01/2015 0522   CO2 25 08/26/2015 0522   GLUCOSE 134* 08/22/2015 0522   BUN 21* 09/06/2015 0522   CREATININE 1.00 08/30/2015 0522   CALCIUM 8.2* 08/29/2015 0522   GFRNONAA >60 09/01/2015 0522   GFRAA >60 08/24/2015 0522    COAG Lab Results  Component Value Date   INR 1.40 08/26/2015   No results found for: PTT  Antibiotics Anti-infectives    Start     Dose/Rate Route Frequency Ordered Stop   09/07/2015 1800  levofloxacin (LEVAQUIN) IVPB 750 mg  Status:  Discontinued     750 mg 100 mL/hr over 90 Minutes Intravenous Every 48 hours 08/09/2015 0125 08/16/2015 1351   08/24/2015 1430  levofloxacin (LEVAQUIN) IVPB 500 mg     500 mg 100 mL/hr over 60 Minutes Intravenous Every 24 hours 08/22/2015 1351     08/31/2015 0400  metroNIDAZOLE (FLAGYL) IVPB 500 mg     500 mg 100 mL/hr over 60 Minutes Intravenous 3 times per day 08/09/2015 0125     08/20/2015 1830  metroNIDAZOLE (FLAGYL) IVPB 500 mg  Status:  Discontinued     500 mg 100  mL/hr over 60 Minutes Intravenous Every 8 hours 08/11/2015 1821 09/03/2015 2133   08/19/2015 1830  levofloxacin (LEVAQUIN) IVPB 750 mg  Status:  Discontinued     750 mg 100 mL/hr over 90 Minutes Intravenous Every 48 hours 08/24/2015 1823 08/21/2015 2133   08/29/2015 1745  ciprofloxacin (CIPRO) IVPB 400 mg  Status:  Discontinued     400 mg 200 mL/hr over 60 Minutes Intravenous  Once 08/23/2015 1732 08/27/2015 1823   08/17/2015 1745  metroNIDAZOLE (FLAGYL) IVPB 500 mg  Status:  Discontinued     500 mg 100 mL/hr over 60 Minutes Intravenous  Once 08/16/2015 1732 08/16/2015 1821   08/31/2015 1745  piperacillin-tazobactam (ZOSYN) IVPB 3.375 g  Status:  Discontinued     3.375 g 100 mL/hr over 30 Minutes Intravenous  Once 09/08/2015 1737 08/16/2015 1737   08/28/2015 1745  levofloxacin (LEVAQUIN) IVPB 750 mg  Status:  Discontinued     750 mg 100 mL/hr over 90 Minutes Intravenous  Once 09/02/2015 1738 09/06/2015 1811   08/14/2015 1745  metroNIDAZOLE (FLAGYL) IVPB 500 mg  Status:  Discontinued     500 mg 100 mL/hr over 60  Minutes Intravenous  Once 08/27/2015 1738 08/20/2015 1812       V. Charlena CrossWells Brabham IV, M.D. [facility] Office: [phone] Pager:  [phone]

## 2015-08-17 NOTE — Progress Notes (Signed)
Notified of POSITIVE MRSA PCR from lab.  POSITIVE MRSA swab protocol activated.

## 2015-08-18 ENCOUNTER — Inpatient Hospital Stay (HOSPITAL_COMMUNITY): Payer: Medicaid Other

## 2015-08-18 LAB — CBC
HEMATOCRIT: 26.4 % — AB (ref 39.0–52.0)
HEMOGLOBIN: 8.7 g/dL — AB (ref 13.0–17.0)
MCH: 29 pg (ref 26.0–34.0)
MCHC: 33 g/dL (ref 30.0–36.0)
MCV: 88 fL (ref 78.0–100.0)
Platelets: 98 10*3/uL — ABNORMAL LOW (ref 150–400)
RBC: 3 MIL/uL — ABNORMAL LOW (ref 4.22–5.81)
RDW: 13.7 % (ref 11.5–15.5)
WBC: 13.8 10*3/uL — AB (ref 4.0–10.5)

## 2015-08-18 LAB — BASIC METABOLIC PANEL
ANION GAP: 7 (ref 5–15)
BUN: 11 mg/dL (ref 6–20)
CALCIUM: 8.1 mg/dL — AB (ref 8.9–10.3)
CHLORIDE: 102 mmol/L (ref 101–111)
CO2: 25 mmol/L (ref 22–32)
Creatinine, Ser: 0.82 mg/dL (ref 0.61–1.24)
Glucose, Bld: 129 mg/dL — ABNORMAL HIGH (ref 65–99)
Potassium: 3.7 mmol/L (ref 3.5–5.1)
Sodium: 134 mmol/L — ABNORMAL LOW (ref 135–145)

## 2015-08-18 LAB — C DIFFICILE QUICK SCREEN W PCR REFLEX
C DIFFICILE (CDIFF) INTERP: NEGATIVE
C DIFFICILE (CDIFF) TOXIN: NEGATIVE
C Diff antigen: NEGATIVE

## 2015-08-18 MED ORDER — SODIUM CHLORIDE 0.9% FLUSH
10.0000 mL | Freq: Two times a day (BID) | INTRAVENOUS | Status: DC
  Administered 2015-08-18 – 2015-08-19 (×2): 30 mL
  Administered 2015-08-19: 20 mL
  Administered 2015-08-20 – 2015-08-25 (×9): 10 mL

## 2015-08-18 MED ORDER — METOPROLOL TARTRATE 1 MG/ML IV SOLN: 10.0000 mg | Freq: Four times a day (QID) | INTRAVENOUS | Status: DC

## 2015-08-18 MED ORDER — HYDRALAZINE HCL 20 MG/ML IJ SOLN
5.0000 mg | INTRAMUSCULAR | Status: DC | PRN
  Administered 2015-08-18 – 2015-08-21 (×6): 5 mg via INTRAVENOUS
  Filled 2015-08-18 (×7): qty 1

## 2015-08-18 MED ORDER — LISINOPRIL 5 MG PO TABS
5.0000 mg | ORAL_TABLET | Freq: Every day | ORAL | Status: DC
  Administered 2015-08-19: 5 mg via ORAL
  Filled 2015-08-18 (×2): qty 1

## 2015-08-18 MED ORDER — LEVOFLOXACIN IN D5W 750 MG/150ML IV SOLN
750.0000 mg | INTRAVENOUS | Status: DC
  Administered 2015-08-18 – 2015-08-22 (×5): 750 mg via INTRAVENOUS
  Filled 2015-08-18 (×5): qty 150

## 2015-08-18 MED ORDER — SODIUM CHLORIDE 0.9% FLUSH: 10.0000 mL | INTRAVENOUS | Status: DC | PRN

## 2015-08-18 MED ORDER — METOPROLOL TARTRATE 1 MG/ML IV SOLN
2.5000 mg | Freq: Four times a day (QID) | INTRAVENOUS | Status: DC
  Administered 2015-08-19 – 2015-08-20 (×9): 2.5 mg via INTRAVENOUS
  Filled 2015-08-18 (×9): qty 5

## 2015-08-18 NOTE — Progress Notes (Signed)
SUBJECTIVE:  No complaints  OBJECTIVE:   Vitals:   Filed Vitals:   08/18/15 0411 08/18/15 0800 08/18/15 0823 08/18/15 0900  BP: 122/74 126/82  132/93  Pulse: 92 89  100  Temp:      TempSrc:      Resp: 21 22  20   Height:      Weight:      SpO2: 95% 97% 98% 95%   I&O's:   Intake/Output Summary (Last 24 hours) at 08/18/15 1004 Last data filed at 08/18/15 0600  Gross per 24 hour  Intake  640.5 ml  Output   1525 ml  Net -884.5 ml   TELEMETRY: Reviewed telemetry pt in NSR:     PHYSICAL EXAM General: Well developed, well nourished, in no acute distress Head: Eyes PERRLA, No xanthomas.   Normal cephalic and atramatic  Lungs:   Clear bilaterally to auscultation and percussion. Heart:   HRRR S1 S2 Pulses are 2+ & equal. Abdomen: Bowel sounds are positive, abdomen soft.  Pulsatile mass in abdomen.  Extremities:   No clubbing, cyanosis or edema.  DP +1 Neuro: Alert and oriented X 3. Psych:  Good affect, responds appropriately   LABS: Basic Metabolic Panel:  Recent Labs  95/62/1310/02/2016 0438 08/09/2015 0522  NA 133* 129*  K 5.0 4.0  CL 97* 96*  CO2 27 25  GLUCOSE 137* 134*  BUN 40* 21*  CREATININE 1.41* 1.00  CALCIUM 8.7* 8.2*   Liver Function Tests:  Recent Labs  03/18/2016 0438  AST 70*  ALT 21  ALKPHOS 58  BILITOT 0.9  PROT 5.8*  ALBUMIN 2.2*   No results for input(s): LIPASE, AMYLASE in the last 72 hours. CBC:  Recent Labs  09/08/2015 1610  09/07/2015 0522 08/18/15 0912  WBC 21.3*  < > 10.3 13.8*  NEUTROABS 19.2*  --   --   --   HGB 10.9*  < > 8.6* 8.7*  HCT 33.8*  < > 26.8* 26.4*  MCV 88.3  < > 88.2 88.0  PLT 121*  < > 96* 98*  < > = values in this interval not displayed. Cardiac Enzymes:  Recent Labs  08/29/2015 2240 03/18/2016 0438 03/18/2016 1032  TROPONINI 0.08* 0.09* 0.06*   BNP: Invalid input(s): POCBNP D-Dimer: No results for input(s): DDIMER in the last 72 hours. Hemoglobin A1C:  Recent Labs  08/14/2015 2155  HGBA1C 5.9*   Fasting  Lipid Panel:  Recent Labs  09/02/2015 2240  CHOL 134  HDL 44  LDLCALC 72  TRIG 88  CHOLHDL 3.0   Thyroid Function Tests:  Recent Labs  09/03/2015 2155  TSH 1.285   Anemia Panel: No results for input(s): VITAMINB12, FOLATE, FERRITIN, TIBC, IRON, RETICCTPCT in the last 72 hours. Coag Panel:   Lab Results  Component Value Date   INR 1.40 09/06/2015    RADIOLOGY: Dg Chest 2 View  09/07/2015  CLINICAL DATA:  Shortness of Breath EXAM: CHEST  2 VIEW COMPARISON:  None. FINDINGS: Cardiomediastinal silhouette is unremarkable. Status post CABG. No infiltrate or pleural effusion. No pulmonary edema. Mild basilar scarring. Osteopenia and mild degenerative changes thoracic spine. Degenerative changes bilateral shoulders. IMPRESSION: No active disease. Status post CABG. Mild basilar scarring. Osteopenia and mild degenerative changes thoracic spine. Electronically Signed   By: Natasha MeadLiviu  Pop M.D.   On: 09/06/2015 15:53   Nm Myocar Multi W/spect W/wall Motion / Ef  08/10/2015   No T wave inversion was noted during stress.  There was no ST segment deviation noted  during stress.  The left ventricular ejection fraction is moderately decreased (30-44%).  This is an intermediate risk study.  1. EF 37% with diffuse hypokinesis, looks worst in the septum. 2. There is a medium-sized, severe fixed inferior perfusion defect consistent with prior infarction.  There is a reversible small but severe apical anterior/apical septal perfusion defect that is suggestive of ischemia.  3. Intermediate risk study.   Ct Angio Abd/pel W/ And/or W/o  08/12/2015  CLINICAL DATA:  Nausea.  Constipation. EXAM: CTA ABDOMEN AND PELVIS wITHOUT AND WITH CONTRAST TECHNIQUE: Multidetector CT imaging of the abdomen and pelvis was performed using the standard protocol during bolus administration of intravenous contrast. Multiplanar reconstructed images and MIPs were obtained and reviewed to evaluate the vascular anatomy. CONTRAST:  80mL  OMNIPAQUE IOHEXOL 350 MG/ML SOLN COMPARISON:  None. FINDINGS: There is a very large infrarenal abdominal aortic aneurysm. It is bilobed. Maximal AP and transverse diameters 9.7 and 10.1 cm respectively. The blood pool is poorly opacified within the aneurysm and iliac vasculature due to large volume. There is no extravasated contrast to suggest ruptured aneurysm. Mild narrowing of the celiac due to median arcuate ligament syndrome SMA is patent with mild atherosclerotic disease at its origin. Two right renal arteries and a single left renal artery are diminutive and patent. IMA origin is occluded.  Branches reconstitute Right common iliac artery aneurysm is 4.1 cm in caliber. Left common iliac artery aneurysm is 1.7 cm in caliber. External iliac arteries are poorly opacified. Diffuse atherosclerotic changes of the external iliac arteries are patent without obvious focal severe narrowing. Severe emphysema towards the lung bases is present. There are multi focal indeterminate pulmonary opacities at the lung base. 1.6 cm more central left lower lobe parenchymal abnormality on image 5. There is a more patchy appearing opacity in the lingula measuring 17 mm on image 6. Tiny right lower lobe abnormality measures 3 mm on image 5. Patchy pulmonary opacities in the posterior and lateral left lung base on image 10 Diffuse hepatic steatosis. Gallbladder, spleen, pancreas, and adrenal glands are within normal limits. There is nonspecific perinephric stranding bilaterally worse on the left. There is significant wall thickening and wall edema involving the distal half of the descending colon, sigmoid colon, and rectum. There are significant inflammatory changes in the perirectal fat. No pneumatosis. No extraluminal bowel gas. No sign of significant diverticulosis in this portion of the colon. Right inguinal hernia contains adipose tissue. Review of the MIP images confirms the above findings. IMPRESSION: Very large infrarenal  abdominal aortic aneurysm with a maximal diameter of 10.1 cm. Right common iliac artery aneurysm with a maximal caliber a 4.1 cm There is extensive wall thickening of the distal colon in a continuous fashion beginning in the descending colon and extending to the rectum with associated inflammatory changes in the perirectal fat. Differential diagnosis includes inflammatory bowel disease, infectious colitis, pseudomembranous colitis, and ischemia. Electronically Signed   By: Jolaine Click M.D.   On: 08/27/2015 17:26    ASSESSMENT AND PLAN:  Micheal Orozco is a 82 y.o. male with a history of CAD s/p CABG 1995 (unable to find records), COPD, HTN glaucoma, macular degeneration and HL who consulted for preop clearance.    AAA (abdominal aortic aneurysm) (HCC)  Colitis, acute  Abdominal aortic aneurysm (AAA) without rupture (HCC)  Abdominal pain  Renal insufficiency  Urinary retention  Bleeding gastrointestinal  Elevated troponin  Coronary artery disease involving coronary bypass graft of native heart with unspecified angina pectoris  Hyponatremia  Hyperkalemia  Unable to find hx of CABG. No documentation at [facility]. The patient unable to provide any specific info. Now here with abdominal bleeding, likely colitis with anemia. 9.7 cm diameter AAA noted on CT. Elevated Trop with flat trend troponin likely due to anemia and acute illness with colitis.  Lexiscan Myoview showed  mdderate LV dysfunction with EF 30-44%.  There is a medium sized fixed inferior perfusion defect c/w prior infarct with no ischemia.  There is also a partially fixed defect that is small in the apical anterior and apical septal region c/w infarct with peri infarct ischemia. This is an intermediate risk study.  He has not had any anginal symptoms although he is pretty sedentary and may not have enough activity to result in angina.  He is moderate risk from a cardiac standpoint for undergoing large AAA resection.   Would try to avoid hypotension during surgery also be cautious with volume overload during surgery given LV dysfunction.  Will follow with you post op.  Continue ASA/statin/BB.  Change Metoprolol to Coreg due to LV dysfunction post op.  Consider addition of ACE I.     Quintella Reichert, MD  08/18/2015  10:04 AM

## 2015-08-18 NOTE — Progress Notes (Signed)
Initial Nutrition Assessment  DOCUMENTATION CODES:   Not applicable  INTERVENTION:    TPN dosing per Pharmacy to meet 100% of nutrition needs as able.  NUTRITION DIAGNOSIS:   Inadequate oral intake related to altered GI function as evidenced by NPO status.  GOAL:   Patient will meet greater than or equal to 90% of their needs  MONITOR:   Diet advancement, Labs, Weight trends, I & O's  REASON FOR ASSESSMENT:   Consult New TPN/TNA  ASSESSMENT:   82 Y/O M with PMX of COPD. CAD S/P CABG, HTN was [place] in from jail for 3 days hx of constipation associated with nausea, no vomiting and hx of blood in his stool.  Patient with probably ischemic colitis. To remain NPO for bowel rest. PICC placed today for administration of TPN. Plans for surgical repair of AAA next week pending cardiac clearance.   Labs reviewed: sodium low. C. Diff negative.   Diet Order:  Diet NPO time specified  Skin:  Reviewed, no issues  Last BM:  3/10  Height:   Ht Readings from Last 1 Encounters:  08/20/2015 5\' 7"  (1.702 m)    Weight:   Wt Readings from Last 1 Encounters:  08/23/2015 156 lb 1.4 oz (70.8 kg)    Ideal Body Weight:  67.3 kg  BMI:  Body mass index is 24.44 kg/(m^2).  Estimated Nutritional Needs:   Kcal:  1750-2000  Protein:  85-100 gm  Fluid:  1.8-2 L  EDUCATION NEEDS:   No education needs identified at this time  Joaquin CourtsKimberly Harris, RD, LDN, CNSC Pager [phone] After Hours Pager [phone]

## 2015-08-18 NOTE — Progress Notes (Signed)
Family Medicine Teaching Service Daily Progress Note Intern Pager: [phone]  Patient name: Micheal Orozco Medical record number: [id number] Date of birth: 13-Aug-1929 Age: 82 y.o. Gender: male  Primary Care Provider: No primary care provider on file. Consultants: Vascular Surgery Code Status: FULL   Pt Overview and Major Events to Date:  3/7: Patient admitted for hematochezia   Assessment and Plan: Micheal Orozco is a 82 y.o. male presenting with hematochezia 1 day. PMH is significant for COPD, CAD, H/o CABG, hypertension, glaucoma, macular degeneration.  # Hematochezia, likely secondary to colitis: Patient reports 1 day history of hematochezia following digital disimpaction. CT showed evidence of sigmoidal colitis. Patient now with minimal streaking of blood in his stools. Treating for probable ischemic colitis, low suspicion for infectious colitis. Bleeding has essentially stopped with bowel rest favoring ischemic colitis. GI panel negative.  - Patient is to be kept NPO; will place PICC line and begin process for TPN  -to rule out other infectious causes not covered by GI panel given this patient's social history, will order hepatitis panel and RPR as well as rectal swab for GC/Chlamydia and rectal culture - Enteric precautions - IV Levaquin and Flagyl (3/7 >>) for bacterial translocation in the setting of ischemic colitis  - IV fluids: D5 NS at 125 cc/hr  - Monitor CBCs for acute blood-loss anemia: HgB 9.8 >> 8.4 >> 8.6 >> 8.7  -GI consulted, appreciate recs >> flexible sigmoidoscopy concerning for ischemic colitis  -probiotic started   # Abdominal aortic aneurysm: 9.7 cm diameter noted on CT.  - Vascular surgery consulted,  Plan  to perform repair next week pending cardiology clearance  -Lexiscan myoview performed given h/o CAD: no T wave inversion or ST segment deviation during stress, LVEF 30-44%, intermediate risk study   -metoprolol 50 mg BID for strict BP control  -Hydralazine 5 mg  q4h prn for SBP >120 and DBP >90 -cardiology recommends switching to Coreg post-op given LV dysfunction   # Lactic acidosis, Resolved: Likely secondary to colitis. Lactic acid 2.1 >> 0.9.   # COPD: Patient requires supplemental O2 at home at 2L. Endorses some mild increase in shortness of breath without chest pain or tightness. No increase of supplemental O2 required at this time. - Continue home medications: Dulera, Atrovent, albuterol. - Continue supplemental oxygen as needed  # Renal dysfunction: Unknown if this is CKD versus AKI as patient does not have a baseline noted in our chart, and last creatinine from [facility] was from 1995 (Cr 1.1). Cr improved from 1.94 to 0.82 with IVFs.  - Avoid nephrotoxic medications. - Will monitor creatinine   # Urinary retention:  Patient states he has never experienced this in the past. Bladder scan by nursing yielded >600 mL's. Differential includes BPH versus prostatitis versus drug-induced versus regional inflammation/edema secondary to colitis. - Foley catheter placed, should not be removed w/o discussion with primary team  - We'll continue to monitor - Holding antihistamine medications (chlorpheniramine)   # CAD, s/p CABG: Denies chest pain at this time. Physical exam yielded palpable peripheral pulses however capillary refill suggests underlying/undiagnosed PAD. Found to have mildly elevated troponins with a flat trend. EKG with some lateral T-wave abnormalities with no previous EKG to compare with.  -cardiology consulted for pre-op clearance  - Continue metoprolol and pravastatin  # Glaucoma (right eye) and bilateral macular degeneration: s/p aqueous shunt placement. Blind in right eye.  - Continue home ophthalmic treatment: Atropine ophthalmic solution, Liquifilm Tears ophthalmic solution.  # Skin  lesions: s/p skin biopsies of ears and nose by dermatology at [facility] on 08/02/15   FEN/GI: D5 NS at 125 mL/hour; NPO Prophylaxis:  SCDs  Disposition: Pending repair of AAA and improvement in hematochezia    Subjective:  Denies active abdominal pain. No complaints.   Objective: Temp:  [99.8 F (37.7 C)-101.2 F (38.4 C)] 100.1 F (37.8 C) (03/10 0400) Pulse Rate:  [85-102] 89 (03/10 0800) Resp:  [15-23] 22 (03/10 0800) BP: (98-136)/(60-84) 126/82 mmHg (03/10 0800) SpO2:  [95 %-100 %] 98 % (03/10 0823) FiO2 (%):  [28 %] 28 % (03/10 1610) Physical Exam: General: elderly male lying in bed in NAD  Cardiovascular: Tachycardic regular rhythm. Distant heart sounds. No murmur appreciated.  Respiratory: Occasional wheeze bilaterally. Normal WOB.  Abdomen: soft, NTND, large pulsatile mass palpable, +BS  Extremities: SCDs in place. Pedal pulses intact and equal.   Laboratory:  Recent Labs Lab 09/06/2015 1700 08/24/2015 2316 08/23/2015 0522  WBC 11.8* 10.3 10.3  HGB 8.8* 8.4* 8.6*  HCT 27.3* 25.5* 26.8*  PLT 94* 86* 96*    Recent Labs Lab 08/31/2015 1610 09/04/2015 1619 09/02/2015 0438 08/17/15 0522  NA 134* 131* 133* 129*  K 5.6* 5.3* 5.0 4.0  CL 91* 91* 97* 96*  CO2 26  --  27 25  BUN 42* 44* 40* 21*  CREATININE 1.94* 1.70* 1.41* 1.00  CALCIUM 10.0  --  8.7* 8.2*  PROT  --   --  5.8*  --   BILITOT  --   --  0.9  --   ALKPHOS  --   --  58  --   ALT  --   --  21  --   AST  --   --  70*  --   GLUCOSE 148* 144* 137* 134*     Imaging/Diagnostic Tests: Dg Chest 2 View  08/11/2015  CLINICAL DATA:  Shortness of Breath EXAM: CHEST  2 VIEW COMPARISON:  None. FINDINGS: Cardiomediastinal silhouette is unremarkable. Status post CABG. No infiltrate or pleural effusion. No pulmonary edema. Mild basilar scarring. Osteopenia and mild degenerative changes thoracic spine. Degenerative changes bilateral shoulders. IMPRESSION: No active disease. Status post CABG. Mild basilar scarring. Osteopenia and mild degenerative changes thoracic spine. Electronically Signed   By: Natasha Mead M.D.   On: 09/06/2015 15:53   Nm Myocar Multi  W/spect W/wall Motion / Ef  08/09/2015   No T wave inversion was noted during stress.  There was no ST segment deviation noted during stress.  The left ventricular ejection fraction is moderately decreased (30-44%).  This is an intermediate risk study.  1. EF 37% with diffuse hypokinesis, looks worst in the septum. 2. There is a medium-sized, severe fixed inferior perfusion defect consistent with prior infarction.  There is a reversible small but severe apical anterior/apical septal perfusion defect that is suggestive of ischemia.  3. Intermediate risk study.   Ct Angio Abd/pel W/ And/or W/o  08/21/2015  CLINICAL DATA:  Nausea.  Constipation. EXAM: CTA ABDOMEN AND PELVIS wITHOUT AND WITH CONTRAST TECHNIQUE: Multidetector CT imaging of the abdomen and pelvis was performed using the standard protocol during bolus administration of intravenous contrast. Multiplanar reconstructed images and MIPs were obtained and reviewed to evaluate the vascular anatomy. CONTRAST:  80mL OMNIPAQUE IOHEXOL 350 MG/ML SOLN COMPARISON:  None. FINDINGS: There is a very large infrarenal abdominal aortic aneurysm. It is bilobed. Maximal AP and transverse diameters 9.7 and 10.1 cm respectively. The blood pool is poorly opacified within the aneurysm  and iliac vasculature due to large volume. There is no extravasated contrast to suggest ruptured aneurysm. Mild narrowing of the celiac due to median arcuate ligament syndrome SMA is patent with mild atherosclerotic disease at its origin. Two right renal arteries and a single left renal artery are diminutive and patent. IMA origin is occluded.  Branches reconstitute Right common iliac artery aneurysm is 4.1 cm in caliber. Left common iliac artery aneurysm is 1.7 cm in caliber. External iliac arteries are poorly opacified. Diffuse atherosclerotic changes of the external iliac arteries are patent without obvious focal severe narrowing. Severe emphysema towards the lung bases is present. There are  multi focal indeterminate pulmonary opacities at the lung base. 1.6 cm more central left lower lobe parenchymal abnormality on image 5. There is a more patchy appearing opacity in the lingula measuring 17 mm on image 6. Tiny right lower lobe abnormality measures 3 mm on image 5. Patchy pulmonary opacities in the posterior and lateral left lung base on image 10 Diffuse hepatic steatosis. Gallbladder, spleen, pancreas, and adrenal glands are within normal limits. There is nonspecific perinephric stranding bilaterally worse on the left. There is significant wall thickening and wall edema involving the distal half of the descending colon, sigmoid colon, and rectum. There are significant inflammatory changes in the perirectal fat. No pneumatosis. No extraluminal bowel gas. No sign of significant diverticulosis in this portion of the colon. Right inguinal hernia contains adipose tissue. Review of the MIP images confirms the above findings. IMPRESSION: Very large infrarenal abdominal aortic aneurysm with a maximal diameter of 10.1 cm. Right common iliac artery aneurysm with a maximal caliber a 4.1 cm There is extensive wall thickening of the distal colon in a continuous fashion beginning in the descending colon and extending to the rectum with associated inflammatory changes in the perirectal fat. Differential diagnosis includes inflammatory bowel disease, infectious colitis, pseudomembranous colitis, and ischemia. Electronically Signed   By: Jolaine ClickArthur  Hoss M.D.   On: 09/02/2015 17:26    Arvilla Marketatherine Lauren Wallace, DO 08/18/2015, 8:46 AM PGY-1, [facility] FPTS Intern pager: [phone], text pages welcome

## 2015-08-18 NOTE — Progress Notes (Signed)
Peripherally Inserted Central Catheter/Midline Placement  The IV Nurse has discussed with the patient and/or persons authorized to consent for the patient, the purpose of this procedure and the potential benefits and risks involved with this procedure.  The benefits include less needle sticks, lab draws from the catheter and patient may be discharged home with the catheter.  Risks include, but not limited to, infection, bleeding, blood clot (thrombus formation), and puncture of an artery; nerve damage and irregular heat beat.  Alternatives to this procedure were also discussed.  PICC/Midline Placement Documentation        Maximino GreenlandLumban, Jabar Krysiak Albarece 08/18/2015, 1:52 PM

## 2015-08-18 NOTE — Progress Notes (Addendum)
  Progress Note    08/18/2015 7:42 AM Hospital Day 4  Subjective:  C/o dry mouth    Filed Vitals:   08/18/15 0400 08/18/15 0411  BP:  122/74  Pulse:  92  Temp: 100.1 F (37.8 C)   Resp:  21    Physical Exam: Cardiac:  regular Lungs:  Non labored Abdomen:  Soft; NT; +pulsatile mass   CBC    Component Value Date/Time   WBC 10.3 08/15/2015 0522   RBC 3.04* 08/20/2015 0522   HGB 8.6* 08/24/2015 0522   HCT 26.8* 08/14/2015 0522   PLT 96* 08/28/2015 0522   MCV 88.2 08/16/2015 0522   MCH 28.3 09/01/2015 0522   MCHC 32.1 08/24/2015 0522   RDW 13.8 08/31/2015 0522   LYMPHSABS 1.0 08/10/2015 1610   MONOABS 1.1* 09/04/2015 1610   EOSABS 0.0 08/20/2015 1610   BASOSABS 0.0 09/06/2015 1610    BMET    Component Value Date/Time   NA 129* 08/16/2015 0522   K 4.0 09/07/2015 0522   CL 96* 08/14/2015 0522   CO2 25 08/14/2015 0522   GLUCOSE 134* 08/16/2015 0522   BUN 21* 08/24/2015 0522   CREATININE 1.00 08/29/2015 0522   CALCIUM 8.2* 08/31/2015 0522   GFRNONAA >60 08/11/2015 0522   GFRAA >60 08/17/2015 0522    INR    Component Value Date/Time   INR 1.40 08/22/2015 2155     Intake/Output Summary (Last 24 hours) at 08/18/15 0742 Last data filed at 08/18/15 0000  Gross per 24 hour  Intake  640.5 ml  Output   1025 ml  Net -384.5 ml     Assessment/Plan:  82 y.o. male with large AAA and colitis Hospital Day 4  -pt with pulsatile mass and abdomen is soft and non tender -plan for AAA repair next week pending cardiology -continue strict BP control   Doreatha MassedSamantha Rhyne, PA-C [facility] [phone] 08/18/2015 7:42 AM  Addendum  Resolution of any active colitis prior to open aortic repair would be important to avoid seeding the aortic graft.  With patent celiac and SMA, I doubt ischemic colitis, which raises concerns for infectious etiology.  Infected aortic grafts have 50-100% mortality depending on the series cited.  Cardiology risk  stratification and preop optimization in progress.  Essentially with extremely large AAA (10 cm), this patient's options are expectant management, i.e. No intervention in event of rupture, vs. surgical repair regardless of the risk, which I expect will likely be >10% mortality.  Leonides SakeBrian Arelys Glassco, MD [facility] [place] Office: [phone] Pager: [phone]  08/18/2015, 8:42 AM

## 2015-08-18 NOTE — Progress Notes (Signed)
Patient denies pain, but per his nurse, he has frequent, liquid bowel movements, which are no longer bloody in character (consistent with resolving ischemic colitis). He is starting to get skin breakdown, so they are planning to do a rectal tube, which is okay with me.   Some of the diarrhea is probably related to his antibiotic therapy, despite cotreatment with Saccharomyces probiotic.  Abdominal exam is benign, specifically, no significant tenderness.  Stool study for enteric pathogen panel came back completely negative.  Stool for C. difficile came back negative.  I spoke with the pathologist on the telephone today. The biopsies from his sigmoidoscopy are consistent with ischemic colitis, which is also what his endoscopic picture was most consistent with.   C. difficile colitis could conceivably have a similar pathologic appearance, but less likely, and especially with the negative C. difficile toxin result today, I feel that diagnosis is essentially excluded.  Therefore, it does not appear that this is an infectious colitis.  We will plan to follow the patient at a distance; depending on the surgeons' preference, it might be reasonable to do a repeat sigmoidoscopy sometime next week to confirm resolution of his colitis prior to possible aneurysm repair. Please call us at any time if more immediate input is desired.  Florencia Reasonsobert V. Aarit Kashuba, M.D. Pager [phone] If no answer or after 5 PM call [phone]

## 2015-08-18 NOTE — Progress Notes (Addendum)
Physician notified: FMTS intern At: 1755  Regarding: rectal tube? Liquid stools, 3x in past two hours with continuous leakage, completely incontinent, buttock/ scrotum red/breakdown started.    Physician notified: Sampson GoonFitzgerald At: 1851  Regarding: FYI did not give dose of lisinopril d/t BP 99/63 and HR 100s, no abd pain, no tenderness, soft. Last dose of IVP hydralazine given at 1148. Will continue to monitor.

## 2015-08-19 DIAGNOSIS — K921 Melena: Secondary | ICD-10-CM

## 2015-08-19 LAB — HEPATITIS B SURFACE ANTIBODY, QUANTITATIVE: Hepatitis B-Post: <3.1 m[IU]/mL — ABNORMAL LOW (ref >9.9)

## 2015-08-19 LAB — CBC
HEMATOCRIT: 26.6 % — AB (ref 39.0–52.0)
Hemoglobin: 8.3 g/dL — ABNORMAL LOW (ref 13.0–17.0)
MCH: 27.5 pg (ref 26.0–34.0)
MCHC: 31.2 g/dL (ref 30.0–36.0)
MCV: 88.1 fL (ref 78.0–100.0)
Platelets: 110 10*3/uL — ABNORMAL LOW (ref 150–400)
RBC: 3.02 MIL/uL — ABNORMAL LOW (ref 4.22–5.81)
RDW: 13.7 % (ref 11.5–15.5)
WBC: 12.9 10*3/uL — ABNORMAL HIGH (ref 4.0–10.5)

## 2015-08-19 LAB — RPR: RPR Ser Ql: NONREACTIVE

## 2015-08-19 LAB — COMPREHENSIVE METABOLIC PANEL
ALBUMIN: 1.9 g/dL — AB (ref 3.5–5.0)
ALT: 15 U/L — ABNORMAL LOW (ref 17–63)
ANION GAP: 7 (ref 5–15)
AST: 24 U/L (ref 15–41)
Alkaline Phosphatase: 36 U/L — ABNORMAL LOW (ref 38–126)
BILIRUBIN TOTAL: 0.6 mg/dL (ref 0.3–1.2)
BUN: 8 mg/dL (ref 6–20)
CO2: 25 mmol/L (ref 22–32)
Calcium: 8.1 mg/dL — ABNORMAL LOW (ref 8.9–10.3)
Chloride: 103 mmol/L (ref 101–111)
Creatinine, Ser: 0.77 mg/dL (ref 0.61–1.24)
GFR calc Af Amer: >60 mL/min (ref >60)
GLUCOSE: 133 mg/dL — AB (ref 65–99)
POTASSIUM: 3.4 mmol/L — AB (ref 3.5–5.1)
Sodium: 135 mmol/L (ref 135–145)
TOTAL PROTEIN: 5.5 g/dL — AB (ref 6.5–8.1)

## 2015-08-19 LAB — DIFFERENTIAL
BASOS ABS: 0 10*3/uL (ref 0.0–0.1)
BASOS PCT: 0 %
EOS ABS: 0.1 10*3/uL (ref 0.0–0.7)
Eosinophils Relative: 1 %
Lymphocytes Relative: 9 %
Lymphs Abs: 1.1 10*3/uL (ref 0.7–4.0)
Monocytes Absolute: 1.1 10*3/uL — ABNORMAL HIGH (ref 0.1–1.0)
Monocytes Relative: 9 %
NEUTROS PCT: 82 %
Neutro Abs: 10.5 10*3/uL — ABNORMAL HIGH (ref 1.7–7.7)

## 2015-08-19 LAB — TRIGLYCERIDES: Triglycerides: 50 mg/dL (ref <150)

## 2015-08-19 LAB — GLUCOSE, CAPILLARY
GLUCOSE-CAPILLARY: 133 mg/dL — AB (ref 65–99)
Glucose-Capillary: 109 mg/dL — ABNORMAL HIGH (ref 65–99)
Glucose-Capillary: 135 mg/dL — ABNORMAL HIGH (ref 65–99)

## 2015-08-19 LAB — PHOSPHORUS: Phosphorus: 1.9 mg/dL — ABNORMAL LOW (ref 2.5–4.6)

## 2015-08-19 LAB — PREALBUMIN: PREALBUMIN: 4.8 mg/dL — AB (ref 18–38)

## 2015-08-19 LAB — MAGNESIUM: MAGNESIUM: 1.7 mg/dL (ref 1.7–2.4)

## 2015-08-19 LAB — HEPATITIS B SURFACE ANTIGEN: Hepatitis B Surface Ag: NEGATIVE

## 2015-08-19 LAB — HEPATITIS B CORE ANTIBODY, TOTAL: Hep B Core Total Ab: NEGATIVE

## 2015-08-19 MED ORDER — DEXTROSE-NACL 5-0.9 % IV SOLN
INTRAVENOUS | Status: AC
  Administered 2015-08-20: 07:00:00 via INTRAVENOUS

## 2015-08-19 MED ORDER — MAGNESIUM SULFATE IN D5W 10-5 MG/ML-% IV SOLN
1.0000 g | Freq: Once | INTRAVENOUS | Status: AC
  Administered 2015-08-19: 1 g via INTRAVENOUS
  Filled 2015-08-19: qty 100

## 2015-08-19 MED ORDER — POTASSIUM PHOSPHATES 15 MMOLE/5ML IV SOLN
20.0000 mmol | Freq: Once | INTRAVENOUS | Status: AC
  Administered 2015-08-19: 20 mmol via INTRAVENOUS
  Filled 2015-08-19: qty 6.67

## 2015-08-19 MED ORDER — INSULIN ASPART 100 UNIT/ML ~~LOC~~ SOLN
0.0000 [IU] | SUBCUTANEOUS | Status: DC
  Administered 2015-08-20: 1 [IU] via SUBCUTANEOUS
  Administered 2015-08-20: 2 [IU] via SUBCUTANEOUS
  Administered 2015-08-20 (×3): 1 [IU] via SUBCUTANEOUS
  Administered 2015-08-21 (×3): 2 [IU] via SUBCUTANEOUS
  Administered 2015-08-21: 1 [IU] via SUBCUTANEOUS
  Administered 2015-08-21 – 2015-08-22 (×3): 2 [IU] via SUBCUTANEOUS
  Administered 2015-08-22: 3 [IU] via SUBCUTANEOUS

## 2015-08-19 MED ORDER — TRACE MINERALS CR-CU-MN-SE-ZN 10-1000-500-60 MCG/ML IV SOLN
INTRAVENOUS | Status: AC
  Administered 2015-08-19: 18:00:00 via INTRAVENOUS
  Filled 2015-08-19: qty 960

## 2015-08-19 MED ORDER — POTASSIUM CHLORIDE 10 MEQ/100ML IV SOLN
10.0000 meq | INTRAVENOUS | Status: AC
  Administered 2015-08-19 (×2): 10 meq via INTRAVENOUS
  Filled 2015-08-19 (×2): qty 100

## 2015-08-19 MED ORDER — FAT EMULSION 20 % IV EMUL
120.0000 mL | INTRAVENOUS | Status: AC
  Administered 2015-08-19: 120 mL via INTRAVENOUS
  Filled 2015-08-19: qty 200

## 2015-08-19 NOTE — Progress Notes (Signed)
Family Medicine Teaching Service Daily Progress Note Intern Pager: [phone]  Patient name: Micheal Orozco Medical record number: [id number] Date of birth: Nov 11, 1929 Age: 82 y.o. Gender: male  Primary Care Provider: No primary care provider on file. Consultants: Vascular Surgery Code Status: FULL   Pt Overview and Major Events to Date:  3/7: Patient admitted for hematochezia  3/9: sigmoidoscopy   Assessment and Plan: Micheal Orozco is a 82 y.o. male presenting with hematochezia 1 day. PMH is significant for COPD, CAD, H/o CABG, hypertension, glaucoma, macular degeneration.  # Hematochezia, likely secondary to ischemic colitis: Patient reports 1 day history of hematochezia following digital disimpaction. CT showed evidence of sigmoidal colitis. Patient now with no blood in stools. Low suspicion for infectious colitis as GI panel negative, however rectal GC/chalmydia pending. Bleeding has essentially stopped with bowel rest favoring ischemic colitis. HIV negative making something like CMV less likely.  RPR and hepatitis B negative and non-immune. Unknown hepatitis A and C status.  - PICC line with TPN  -f/u GC/Chlamydia rectal culture - Enteric precautions - IV Levaquin and Flagyl (3/7 >>) for bacterial translocation in the setting of ischemic colitis  - IV fluids: D5 NS at 125 cc/hr  - Monitor CBCs for acute blood-loss anemia: HgB 9.8 >> 8.4 >> 8.6 >> 8.7 >8.3 -GI consulted and signed off (will repeat sigmoidoscopy next week if desired by VVS prior to AAA repair).  -probiotic started, rectal tube in place - patient had SLP evaluation- would like to do MBS when able. Would like pt to be aware that swallowing may worsen after surgery given intubation, etc.    # Abdominal aortic aneurysm: 9.7 cm diameter noted on CT.  - Vascular surgery consulted,  Plan  to perform repair next week (suren if this will be an open surgery -Lexiscan myoview performed given h/o CAD: no T wave inversion or ST  segment deviation during stress, LVEF 30-44%, intermediate risk study   -metoprolol 50 mg BID changed to 2.5mg  IV q6hrs.  -Hydralazine 5 mg q4h prn for SBP >120 and DBP >90 -cardiology recommends switching to Coreg post-op given LV dysfunction.  - lisinopril added  # Lactic acidosis, Resolved: Likely secondary to colitis. Lactic acid 2.1 >> 0.9.   # COPD: Patient requires supplemental O2 at home at 2L. Endorses some mild increase in shortness of breath without chest pain or tightness. No increase of supplemental O2 required at this time. - Continue home medications: Dulera, Atrovent, albuterol. - Continue supplemental oxygen as needed  # Renal dysfunction: Unknown if this is CKD versus AKI as patient does not have a baseline noted in our chart, and last creatinine from [facility] was from 1995 (Cr 1.1). Cr improved from 1.94 to 0.77 with IVFs.  - Avoid nephrotoxic medications. - Will monitor creatinine   # Urinary retention:  Patient states he has never experienced this in the past. Bladder scan by nursing yielded >600 mL's. Differential includes BPH versus prostatitis versus drug-induced versus regional inflammation/edema secondary to colitis. - Foley catheter placed, should not be removed w/o discussion with primary team  - We'll continue to monitor - Holding antihistamine medications (chlorpheniramine)   # CAD, s/p CABG: Denies chest pain at this time. Physical exam yielded palpable peripheral pulses however capillary refill suggests underlying/undiagnosed PAD. Found to have mildly elevated troponins with a flat trend. EKG with some lateral T-wave abnormalities with no previous EKG to compare with.  -cardiology consulted for pre-op clearance: patient with an intermediate risk study, cardiology recommends  avoiding hypotension intra-operatively.  - Continue metoprolol (change to coreg post op) and pravastatin - consider changing to a high intensity statin in the future  #  Glaucoma (right eye) and bilateral macular degeneration: s/p aqueous shunt placement. Blind in right eye.  - Continue home ophthalmic treatment: Atropine ophthalmic solution, Liquifilm Tears ophthalmic solution.  #Hypolakemia: K 3.4 today.  - will replete with IV potassium x 2 day, will most likely improve with TPN.   # Skin lesions: s/p skin biopsies of ears and nose by dermatology at [facility] on 08/02/15  FEN/GI: D5 NS at 125 mL/hour; TPN Prophylaxis: SCDs  Disposition: Pending repair of AAA.    Subjective:  Patient doing well. Denies any pain. Nervous about future surgery. No hematochezia   Objective: Temp:  [99.2 F (37.3 C)-100 F (37.8 C)] 99.2 F (37.3 C) (03/11 0359) Pulse Rate:  [85-116] 116 (03/11 0600) Resp:  [17-24] 22 (03/11 0600) BP: (98-132)/(62-93) 119/73 mmHg (03/11 0600) SpO2:  [92 %-100 %] 95 % (03/11 0600) FiO2 (%):  [28 %] 28 % (03/10 1525) Physical Exam: General: elderly male lying in bed in NAD  Cardiovascular: Tachycardic regular rhythm. Distant heart sounds. No murmur appreciated.  Respiratory: Occasional wheeze bilaterally, otherwise clear. No increased WOB.  Abdomen: soft, NTND, large pulsatile mass palpable, +BS  Extremities: SCDs in place. Pedal pulses intact and equal.   Laboratory:  Recent Labs Lab 09/06/2015 0522 08/18/15 0912 08/19/15 0350  WBC 10.3 13.8* 12.9*  HGB 8.6* 8.7* 8.3*  HCT 26.8* 26.4* 26.6*  PLT 96* 98* 110*    Recent Labs Lab 09/07/2015 0438 08/24/2015 0522 08/18/15 0912 08/19/15 0350  NA 133* 129* 134* 135  K 5.0 4.0 3.7 3.4*  CL 97* 96* 102 103  CO2 BUN 40* 21* 11 8  CREATININE 1.41* 1.00 0.82 0.77  CALCIUM 8.7* 8.2* 8.1* 8.1*  PROT 5.8*  --   --  5.5*  BILITOT 0.9  --   --  0.6  ALKPHOS 58  --   --  36*  ALT 21  --   --  15*  AST 70*  --   --  24  GLUCOSE 137* 134* 129* 133*     Imaging/Diagnostic Tests: Dg Chest 2 View  09/01/2015  CLINICAL DATA:  Shortness of Breath EXAM: CHEST  2 VIEW  COMPARISON:  None. FINDINGS: Cardiomediastinal silhouette is unremarkable. Status post CABG. No infiltrate or pleural effusion. No pulmonary edema. Mild basilar scarring. Osteopenia and mild degenerative changes thoracic spine. Degenerative changes bilateral shoulders. IMPRESSION: No active disease. Status post CABG. Mild basilar scarring. Osteopenia and mild degenerative changes thoracic spine. Electronically Signed   By: Natasha Mead M.D.   On: 08/13/2015 15:53   Nm Myocar Multi W/spect W/wall Motion / Ef  09/04/2015   No T wave inversion was noted during stress.  There was no ST segment deviation noted during stress.  The left ventricular ejection fraction is moderately decreased (30-44%).  This is an intermediate risk study.  1. EF 37% with diffuse hypokinesis, looks worst in the septum. 2. There is a medium-sized, severe fixed inferior perfusion defect consistent with prior infarction.  There is a reversible small but severe apical anterior/apical septal perfusion defect that is suggestive of ischemia.  3. Intermediate risk study.   Dg Chest Port 1 View  08/18/2015  CLINICAL DATA:  Right-sided PICC line insertion. EXAM: PORTABLE CHEST 1 VIEW COMPARISON:  09/01/2015 FINDINGS: Fourteen 35 hrs. Hyperexpansion is consistent with emphysema.  Interstitial markings are diffusely coarsened with chronic features. Architectural distortion noted in the lungs bilaterally. Right PICC line is new in the interval with tip overlying the distal SVC level. Patient is status post CABG. Telemetry leads overlie the chest. IMPRESSION: Right PICC line tip overlies the distal SVC. No acute cardiopulmonary findings. Electronically Signed   By: Kennith CenterEric  Mansell M.D.   On: 08/18/2015 15:00   Ct Angio Abd/pel W/ And/or W/o  08/14/2015  CLINICAL DATA:  Nausea.  Constipation. EXAM: CTA ABDOMEN AND PELVIS wITHOUT AND WITH CONTRAST TECHNIQUE: Multidetector CT imaging of the abdomen and pelvis was performed using the standard protocol  during bolus administration of intravenous contrast. Multiplanar reconstructed images and MIPs were obtained and reviewed to evaluate the vascular anatomy. CONTRAST:  80mL OMNIPAQUE IOHEXOL 350 MG/ML SOLN COMPARISON:  None. FINDINGS: There is a very large infrarenal abdominal aortic aneurysm. It is bilobed. Maximal AP and transverse diameters 9.7 and 10.1 cm respectively. The blood pool is poorly opacified within the aneurysm and iliac vasculature due to large volume. There is no extravasated contrast to suggest ruptured aneurysm. Mild narrowing of the celiac due to median arcuate ligament syndrome SMA is patent with mild atherosclerotic disease at its origin. Two right renal arteries and a single left renal artery are diminutive and patent. IMA origin is occluded.  Branches reconstitute Right common iliac artery aneurysm is 4.1 cm in caliber. Left common iliac artery aneurysm is 1.7 cm in caliber. External iliac arteries are poorly opacified. Diffuse atherosclerotic changes of the external iliac arteries are patent without obvious focal severe narrowing. Severe emphysema towards the lung bases is present. There are multi focal indeterminate pulmonary opacities at the lung base. 1.6 cm more central left lower lobe parenchymal abnormality on image 5. There is a more patchy appearing opacity in the lingula measuring 17 mm on image 6. Tiny right lower lobe abnormality measures 3 mm on image 5. Patchy pulmonary opacities in the posterior and lateral left lung base on image 10 Diffuse hepatic steatosis. Gallbladder, spleen, pancreas, and adrenal glands are within normal limits. There is nonspecific perinephric stranding bilaterally worse on the left. There is significant wall thickening and wall edema involving the distal half of the descending colon, sigmoid colon, and rectum. There are significant inflammatory changes in the perirectal fat. No pneumatosis. No extraluminal bowel gas. No sign of significant  diverticulosis in this portion of the colon. Right inguinal hernia contains adipose tissue. Review of the MIP images confirms the above findings. IMPRESSION: Very large infrarenal abdominal aortic aneurysm with a maximal diameter of 10.1 cm. Right common iliac artery aneurysm with a maximal caliber a 4.1 cm There is extensive wall thickening of the distal colon in a continuous fashion beginning in the descending colon and extending to the rectum with associated inflammatory changes in the perirectal fat. Differential diagnosis includes inflammatory bowel disease, infectious colitis, pseudomembranous colitis, and ischemia. Electronically Signed   By: Jolaine ClickArthur  Hoss M.D.   On: 08/18/2015 17:26    Joanna Puffrystal S Jnae Thomaston, MD 08/19/2015, 8:08 AM PGY-2, [facility] FPTS Intern pager: [phone], text pages welcome

## 2015-08-19 NOTE — Progress Notes (Addendum)
PARENTERAL NUTRITION CONSULT NOTE - INITIAL  Pharmacy Consult for TPN Indication: Ischemic bowel  Allergies  Allergen Reactions  . Penicillins   . Sulindac     Patient Measurements: Height: 5\' 7"  (170.2 cm) Weight: 156 lb 1.4 oz (70.8 kg) IBW/kg (Calculated) : 66.1 Adjusted Body Weight:  Usual Weight:   Vital Signs: Temp: 99.1 F (37.3 C) (03/11 0816) Temp Source: Oral (03/11 0816) BP: 111/78 mmHg (03/11 0816) Pulse Rate: 114 (03/11 0816) Intake/Output from previous day: 03/10 0701 - 03/11 0700 In: 3812.5 [P.O.:50; I.V.:3562.5; IV Piggyback:200] Out: 1575 [Urine:1525; Stool:50] Intake/Output from this shift:    Labs:  Recent Labs  08/13/2015 0522 08/18/15 0912 08/19/15 0350  WBC 10.3 13.8* 12.9*  HGB 8.6* 8.7* 8.3*  HCT 26.8* 26.4* 26.6*  PLT 96* 98* 110*     Recent Labs  08/26/2015 0522 08/18/15 0912 08/19/15 0350  NA 129* 134* 135  K 4.0 3.7 3.4*  CL 96* 102 103  CO2 25 25 25   GLUCOSE 134* 129* 133*  BUN 21* 11 8  CREATININE 1.00 0.82 0.77  CALCIUM 8.2* 8.1* 8.1*  MG  --   --  1.7  PHOS  --   --  1.9*  PROT  --   --  5.5*  ALBUMIN  --   --  1.9*  AST  --   --  24  ALT  --   --  15*  ALKPHOS  --   --  36*  BILITOT  --   --  0.6  TRIG  --   --  50   Estimated Creatinine Clearance: 65.4 mL/min (by C-G formula based on Cr of 0.77).    Recent Labs  08/18/2015 1127 08/22/2015 1623  GLUCAP 136* 133*    Medical History: Past Medical History  Diagnosis Date  . COPD (chronic obstructive pulmonary disease) (HCC)   . Renal insufficiency     Medications:  Scheduled:  . antiseptic oral rinse  7 mL Mouth Rinse BID  . atropine  1 drop Right Eye BID  . Chlorhexidine Gluconate Cloth  6 each Topical Q0600  . gabapentin  400 mg Oral TID  . ipratropium-albuterol  3 mL Nebulization QID  . levofloxacin (LEVAQUIN) IV  750 mg Intravenous Q24H  . lisinopril  5 mg Oral Daily  . metoprolol  2.5 mg Intravenous 4 times per day  . metronidazole  500 mg  Intravenous 3 times per day  . mometasone-formoterol  2 puff Inhalation BID  . mupirocin ointment  1 application Nasal BID  . pravastatin  40 mg Oral q1800  . regadenoson  0.4 mg Intravenous Once  . saccharomyces boulardii  250 mg Oral BID  . sodium chloride flush  10-40 mL Intracatheter Q12H  . sodium chloride flush  3 mL Intravenous Q12H    Insulin Requirements in the past 24 hours:  Currently on no insulin  Current Nutrition:  NPO  Assessment: 82yo male admitted 3/7 with bloody stools and suspected colitis.  CT at that time (+)massive AAA and decreasing Hg over first 24hr.  Biopsies from sigmoidoscopy on 3/9 are c/w ischemic colitis; BMs are no longer bloody reflecting resolving ischemia.  He is to start TPN for continued bowel rest.  Surgeries/Procedures: 3/9- Sigmoidoscopy For AAA repair in the coming week  GI:   NPO since 3/7.  Ischemic bowel.  Rectal tube for frequent non-bloody BMs.  CDiff & Enteric pathogen panel negative. Endo:  No hx DM.  Serum gluc 133.  Currently on  no SSI Lytes:   K 3.4, Mg 1.7, Phos 1.9, Ca 8.1 (CoCa 9.8) Renal:   Cr 0.77.  UOP 0.30ml/kg/hr.  I/O 1.7L.  D5NS at 145ml/hr Pulm:  Hx COPD.  DuoNeb, Dulera Cards:   VSS.  Lisinopril, Metoprolol IV, Pravastatin (not receiving po meds consistently) Hepatobil:   LFTs wnl/low.  TG 50 ID:   Metronidazole and Levofloxacin for ischemic colitis.  Tm 100  Best Practices:   SCDs TPN Access:  PICC 3/10 TPN start date:  3/11 >>  Nutritional Goals:  1750-2000 kCal, 85-100 grams of protein per day  Plan:  Clinimix-E 5/15 at 58ml/hr Decrease IVF to 68ml/hr when TPN hung, and further decrease rate as TPN advanced Mg 1g IV x 1 K-Phos IV x 1 Sensitive SSI q4 TPN labs in AM  Marisue Humble, PharmD Clinical Pharmacist [facility]

## 2015-08-19 NOTE — Progress Notes (Signed)
Patient with run of V tach. Repleting magnesium and potassium.  Joanna Puffrystal S. Kaprice Kage, MD [facility] Resident

## 2015-08-19 NOTE — Progress Notes (Signed)
Informed MD regarding pts run of Vtach and increased HR. No new orders received.   Also requested ice chips for pt and received order.

## 2015-08-19 NOTE — Evaluation (Signed)
Clinical/Bedside Swallow Evaluation Patient Details  Name: Micheal Orozco MRN: [id number] Date of Birth: 11/01/1929  Today's Date: 08/19/2015 Time:        Past Medical History:  Past Medical History  Diagnosis Date  . COPD (chronic obstructive pulmonary disease) (HCC)   . Renal insufficiency    Past Surgical History:  Past Surgical History  Procedure Laterality Date  . Triple bypass  1995  . Flexible sigmoidoscopy N/A 08/16/2015    Procedure: FLEXIBLE SIGMOIDOSCOPY;  Surgeon: Bernette Redbirdobert Buccini, MD;  [facility];  Service: Endoscopy;  Laterality: N/A;   HPI:  Micheal Orozco is a 82 y.o. male presenting with hematochezia 1 day secondary to colitis. Found to have very large abdominal aortic aneurysm with possible plan for surgical intervention. PMH is significant for COPD, CAD, H/o CABG, hypertension, glaucoma, macular degeneration.   Assessment / Plan / Recommendation Clinical Impression  Pt presents with signs of a mild to moderate dysphagia at bedside. Timing of swallow is impaired and worsens over time given increased work of breath with PO intake. Pt does report frequent choking with PO and is noted to have have delayed coughing with liquids and solids under observation. Objective testing would be needed to determine level of risk of aspiration and potential need for diet modification. For now, pt is to remain NPO for GI purposes and so recomend pt continue ice chips and meds in puree for now. Will be happy to proceed with testing or modified diet when pt is ready to participate. Will discuss potential plan of care with MD including when an MBS could be attempted and expected decline in swallow function after any surgical intervention.     Aspiration Risk  Moderate aspiration risk    Diet Recommendation NPO except meds;Ice chips PRN after oral care   Medication Administration: Whole meds with puree Supervision: Staff to assist with self feeding Compensations: Slow rate;Small  sips/bites Postural Changes: Seated upright at 90 degrees    Other  Recommendations     Follow up Recommendations  24 hour supervision/assistance    Frequency and Duration            Prognosis Prognosis for Safe Diet Advancement: Fair      Swallow Study   General HPI: Micheal Orozco is a 82 y.o. male presenting with hematochezia 1 day secondary to colitis. Found to have very large abdominal aortic aneurysm with possible plan for surgical intervention. PMH is significant for COPD, CAD, H/o CABG, hypertension, glaucoma, macular degeneration. Type of Study: Bedside Swallow Evaluation Diet Prior to this Study: NPO Temperature Spikes Noted: No Respiratory Status: Nasal cannula History of Recent Intubation: No Behavior/Cognition: Alert;Cooperative;Pleasant mood Oral Cavity Assessment: Within Functional Limits Oral Care Completed by SLP: No Oral Cavity - Dentition: Edentulous Vision: Impaired for self-feeding Self-Feeding Abilities: Able to feed self Patient Positioning: Upright in bed Baseline Vocal Quality: Normal Volitional Cough: Congested Volitional Swallow: Able to elicit    Oral/Motor/Sensory Function Overall Oral Motor/Sensory Function: Within functional limits   Ice Chips Ice chips: Not tested   Thin Liquid Thin Liquid: Impaired Pharyngeal  Phase Impairments: Suspected delayed Swallow;Multiple swallows;Cough - Delayed    Nectar Thick Nectar Thick Liquid: Not tested   Honey Thick Honey Thick Liquid: Not tested   Puree Puree: Impaired Pharyngeal Phase Impairments: Suspected delayed Swallow;Throat Clearing - Delayed   Solid   GO   Solid: Not tested       Harlon DittyBonnie Karolee Meloni, MA CCC-SLP [phone]  Claudine MoutonDeBlois, Micheal Orozco 08/19/2015,9:46 AM

## 2015-08-19 NOTE — Progress Notes (Addendum)
[facility] [place]  Subjective  - Doing OK no new back or abdominal pain.   Objective 111/78 114 99.1 F (37.3 C) (Oral) 19 100%  Intake/Output Summary (Last 24 hours) at 08/19/15 0911 Last data filed at 08/19/15 0700  Gross per 24 hour  Intake 3812.5 ml  Output   1575 ml  Net 2237.5 ml    Abdomin soft NTTP Palpable DP 3+ B   Assessment/Planning: 82 y.o. male with large AAA and colitis Hospital Day 5 -pt with pulsatile mass and abdomen is soft and non tender -plan for AAA repair next week.  Cardiology work up complete see cardiology note. -continue strict BP control.    Clinton GallantCOLLINS, EMMA [facility] 08/19/2015 9:11 AM --  Laboratory Lab Results:  Recent Labs  08/18/15 0912 08/19/15 0350  WBC 13.8* 12.9*  HGB 8.7* 8.3*  HCT 26.4* 26.6*  PLT 98* 110*   BMET  Recent Labs  08/18/15 0912 08/19/15 0350  NA 134* 135  K 3.7 3.4*  CL 102 103  CO2 25 25  GLUCOSE 129* 133*  BUN 11 8  CREATININE 0.82 0.77  CALCIUM 8.1* 8.1*    COAG Lab Results  Component Value Date   INR 1.40 June 14, 2015   No results found for: PTT  Addendum  I have independently interviewed and examined the patient, and I agree with the physician assistant's findings.  Pt currently asx.  Cardiac work-up completed: moderate risk.  AAA management per Dr. Myra GianottiBrabham, who will be back on Monday.  No intervention planned over the weekend.  Leonides SakeBrian Chaniah Cisse, MD [facility] [place] Office: [phone] Pager: [phone]  08/19/2015, 2:08 PM

## 2015-08-20 ENCOUNTER — Inpatient Hospital Stay (HOSPITAL_COMMUNITY): Payer: Medicaid Other

## 2015-08-20 LAB — LIPID PANEL
CHOL/HDL RATIO: 2.7 ratio
CHOLESTEROL: 65 mg/dL (ref 0–200)
HDL: 24 mg/dL — ABNORMAL LOW (ref >40)
LDL Cholesterol: 31 mg/dL (ref 0–99)
Triglycerides: 50 mg/dL (ref <150)
VLDL: 10 mg/dL (ref 0–40)

## 2015-08-20 LAB — BASIC METABOLIC PANEL
Anion gap: 5 (ref 5–15)
BUN: 8 mg/dL (ref 6–20)
CHLORIDE: 103 mmol/L (ref 101–111)
CO2: 24 mmol/L (ref 22–32)
Calcium: 7.9 mg/dL — ABNORMAL LOW (ref 8.9–10.3)
Creatinine, Ser: 0.69 mg/dL (ref 0.61–1.24)
GFR calc Af Amer: >60 mL/min (ref >60)
GFR calc non Af Amer: >60 mL/min (ref >60)
GLUCOSE: 161 mg/dL — AB (ref 65–99)
Potassium: 3.3 mmol/L — ABNORMAL LOW (ref 3.5–5.1)
SODIUM: 132 mmol/L — AB (ref 135–145)

## 2015-08-20 LAB — GLUCOSE, CAPILLARY
GLUCOSE-CAPILLARY: 134 mg/dL — AB (ref 65–99)
GLUCOSE-CAPILLARY: 141 mg/dL — AB (ref 65–99)
GLUCOSE-CAPILLARY: 153 mg/dL — AB (ref 65–99)
Glucose-Capillary: 131 mg/dL — ABNORMAL HIGH (ref 65–99)
Glucose-Capillary: 136 mg/dL — ABNORMAL HIGH (ref 65–99)
Glucose-Capillary: 132 mg/dL — ABNORMAL HIGH (ref 65–99)

## 2015-08-20 LAB — CBC
HCT: 26.8 % — ABNORMAL LOW (ref 39.0–52.0)
HEMOGLOBIN: 8.4 g/dL — AB (ref 13.0–17.0)
MCH: 27.5 pg (ref 26.0–34.0)
MCHC: 31.3 g/dL (ref 30.0–36.0)
MCV: 87.9 fL (ref 78.0–100.0)
Platelets: 121 10*3/uL — ABNORMAL LOW (ref 150–400)
RBC: 3.05 MIL/uL — ABNORMAL LOW (ref 4.22–5.81)
RDW: 13.8 % (ref 11.5–15.5)
WBC: 14 10*3/uL — ABNORMAL HIGH (ref 4.0–10.5)

## 2015-08-20 LAB — MAGNESIUM: Magnesium: 1.7 mg/dL (ref 1.7–2.4)

## 2015-08-20 MED ORDER — POTASSIUM CHLORIDE 10 MEQ/100ML IV SOLN
10.0000 meq | INTRAVENOUS | Status: AC
  Administered 2015-08-20 (×3): 10 meq via INTRAVENOUS
  Filled 2015-08-20 (×3): qty 100

## 2015-08-20 MED ORDER — TRACE MINERALS CR-CU-MN-SE-ZN 10-1000-500-60 MCG/ML IV SOLN
INTRAVENOUS | Status: AC
  Administered 2015-08-20: 18:00:00 via INTRAVENOUS
  Filled 2015-08-20: qty 1440

## 2015-08-20 MED ORDER — GI COCKTAIL ~~LOC~~
30.0000 mL | Freq: Two times a day (BID) | ORAL | Status: DC | PRN
  Filled 2015-08-20: qty 30

## 2015-08-20 MED ORDER — METOPROLOL TARTRATE 1 MG/ML IV SOLN
5.0000 mg | Freq: Four times a day (QID) | INTRAVENOUS | Status: DC
  Administered 2015-08-21: 5 mg via INTRAVENOUS
  Filled 2015-08-20 (×2): qty 5

## 2015-08-20 MED ORDER — GUAIFENESIN ER 600 MG PO TB12
600.0000 mg | ORAL_TABLET | Freq: Two times a day (BID) | ORAL | Status: DC
  Administered 2015-08-20 (×2): 600 mg via ORAL
  Filled 2015-08-20 (×3): qty 1

## 2015-08-20 MED ORDER — MAGNESIUM SULFATE 2 GM/50ML IV SOLN
2.0000 g | Freq: Once | INTRAVENOUS | Status: AC
  Administered 2015-08-20: 2 g via INTRAVENOUS
  Filled 2015-08-20: qty 50

## 2015-08-20 MED ORDER — DEXTROSE-NACL 5-0.9 % IV SOLN
INTRAVENOUS | Status: DC
  Administered 2015-08-20 – 2015-08-21 (×2): via INTRAVENOUS

## 2015-08-20 MED ORDER — LISINOPRIL 10 MG PO TABS
10.0000 mg | ORAL_TABLET | Freq: Every day | ORAL | Status: DC
  Administered 2015-08-20: 10 mg via ORAL
  Filled 2015-08-20 (×2): qty 1

## 2015-08-20 MED ORDER — FAT EMULSION 20 % IV EMUL
240.0000 mL | INTRAVENOUS | Status: AC
  Administered 2015-08-20: 240 mL via INTRAVENOUS
  Filled 2015-08-20: qty 250

## 2015-08-20 NOTE — Progress Notes (Signed)
MBSS complete. Full report located under chart review in imaging section. Malarie Tappen, MA CCC-SLP [phone]  

## 2015-08-20 NOTE — Progress Notes (Signed)
Family Medicine Teaching Service Daily Progress Note Intern Pager: [phone]  Patient name: Micheal Orozco Medical record number: [id number] Date of birth: [birth year] Age: 82 y.o. Gender: male  Primary Care Provider: No primary care provider on file. Consultants: Vascular Surgery Code Status: FULL   Pt Overview and Major Events to Date:  3/7: Patient admitted for hematochezia  3/9: sigmoidoscopy   Assessment and Plan: Micheal Orozco is a 82 y.o. male presenting with hematochezia 1 day. PMH is significant for COPD, CAD, H/o CABG, hypertension, glaucoma, macular degeneration.  # Hematochezia, likely secondary to ischemic colitis: Patient reports 1 day history of hematochezia following digital disimpaction. CT showed evidence of sigmoidal colitis. Patient now with no blood in stools. Low suspicion for infectious colitis as GI panel negative, however rectal GC/chalmydia pending. Bleeding has essentially stopped with bowel rest favoring ischemic colitis. HIV negative making something like CMV less likely.  RPR and hepatitis B negative and non-immune. Unknown hepatitis A and C status. Patient with jump in WBC from 12 to 14, unsure if this was secondary to TPN  - PICC line with TPN  - Modified barium swallow ordered (no longer have hematochezia however failed bedside swallow) -f/u GC/Chlamydia rectal culture - Enteric precautions - IV Levaquin and Flagyl (3/7 >>) for bacterial translocation in the setting of ischemic colitis  - IV fluids: D5 NS at 85 cc/hr while TPN going - Monitor CBCs for acute blood-loss anemia: HgB 9.8 >> 8.4 >> 8.6 >> 8.7 >8.3 >8.4 -GI consulted and signed off (will repeat sigmoidoscopy next week if desired by [facility]VS prior to AAA repair).  -probiotic started, rectal tube in place  # Abdominal aortic aneurysm: 9.7 cm diameter noted on CT.  - Vascular surgery consulted,  Plan  to perform repair next week (suren if this will be an open surgery -Lexiscan myoview performed given h/o  CAD: no T wave inversion or ST segment deviation during stress, LVEF 30-44%, intermediate risk study   -metoprolol 50 mg BID changed to 2.5mg  IV q6hrs -Hydralazine 5 mg q4h prn for SBP >120 and DBP >90; not given overnight, stressed to RN concerning strict BP control.  -cardiology recommends switching to Coreg post-op given LV dysfunction.  - lisinopril increased today to help keep BPs at goal.   # Increase work of breathing: patient has been tachypnic intermittently throughout his stay, however noted to have an increased work of breathing this AM with wheezing on exam. RN earlier this AM had noted more difficulty with phlegm and order placed for mucinex.   - CXR STAT - this may be due to difficulty maintaining secretions, however would like to avoid anticholinergics given age and h/o urinary retention if possible  - low threshold to have CCM come evaluate.   # Lactic acidosis, Resolved: Likely secondary to colitis. Lactic acid 2.1 >> 0.9.   # COPD: Patient requires supplemental O2 at home at 2L. Endorses some mild increase in shortness of breath without chest pain or tightness. Appears to be more for comfort as no desaturations noted.  - Continue home medications: Dulera, Atrovent, albuterol. - Continue supplemental oxygen as needed  # Renal dysfunction: resolved with IVFs - Avoid nephrotoxic medications. - Will monitor creatinine  # Urinary retention:  Patient states he has never experienced this in the past. Bladder scan by nursing yielded >600 mL's. Differential includes BPH versus prostatitis versus drug-induced versus regional inflammation/edema secondary to colitis. - Foley catheter placed, should not be removed w/o discussion with primary team  - We'll continue  to monitor - Holding antihistamine medications (chlorpheniramine)   # CAD, s/p CABG: Denies chest pain at this time. Physical exam yielded palpable peripheral pulses however capillary refill suggests underlying/undiagnosed  PAD. Found to have mildly elevated troponins with a flat trend. EKG with some lateral T-wave abnormalities with no previous EKG to compare with.  -cardiology consulted for pre-op clearance: patient with an intermediate risk study, cardiology recommends avoiding hypotension intra-operatively.  - Continue metoprolol (change to coreg post op) and pravastatin - consider changing to a high intensity statin in the future  # Glaucoma (right eye) and bilateral macular degeneration: s/p aqueous shunt placement. Blind in right eye.  - Continue home ophthalmic treatment: Atropine ophthalmic solution, Liquifilm Tears ophthalmic solution.  #Hypolakemia: K 3.3 today.  - will replete with IV potassium x 3 today, will most likely improve with TPN.   # Skin lesions: [facility]  FEN/GI: D5 NS at 85 mL/hour; TPN Prophylaxis: SCDs  Disposition: Pending repair of AAA.    Subjective:  Patient doing well. Denies any pain. No hematochezia. Denis SOB or chest palpitations.   Objective: Temp:  [98.9 F (37.2 C)-99.3 F (37.4 C)] 99.2 F (37.3 C) (03/12 0022) Pulse Rate:  [100-117] 109 (03/12 0022) Resp:  [15-28] 15 (03/12 0022) BP: (105-130)/(69-89) 121/82 mmHg (03/12 0022) SpO2:  [92 %-100 %] 95 % (03/12 0022) Physical Exam: General: elderly male lying in bed.  Cardiovascular: Tachycardic regular rhythm. Distant heart sounds. No murmur appreciated.  Respiratory: Wheezes bilaterally, otherwise clear. Some increased work of breathing with belly breathing and supraclavicular retractions.  Abdomen: soft, NTND, large pulsatile mass palpable, +BS  Extremities: SCDs in place. Pedal pulses intact and equal.   Laboratory:  Recent Labs Lab 09/02/2015 0522 08/18/15 0912 08/19/15 0350  WBC 10.3 13.8* 12.9*  HGB 8.6* 8.7* 8.3*  HCT 26.8* 26.4* 26.6*  PLT 96* 98* 110*    Recent Labs Lab 08/13/2015 0438 09/08/2015 0522 08/18/15 0912  08/19/15 0350  NA 133* 129* 134* 135  K 5.0 4.0 3.7 3.4*  CL 97* 96* 102 103  CO2 27 25 25 25   BUN 40* 21* 11 8  CREATININE 1.41* 1.00 0.82 0.77  CALCIUM 8.7* 8.2* 8.1* 8.1*  PROT 5.8*  --   --  5.5*  BILITOT 0.9  --   --  0.6  ALKPHOS 58  --   --  36*  ALT 21  --   --  15*  AST 70*  --   --  24  GLUCOSE 137* 134* 129* 133*     Imaging/Diagnostic Tests: Dg Chest 2 View  08/19/2015  CLINICAL DATA:  Shortness of Breath EXAM: CHEST  2 VIEW COMPARISON:  None. FINDINGS: Cardiomediastinal silhouette is unremarkable. Status post CABG. No infiltrate or pleural effusion. No pulmonary edema. Mild basilar scarring. Osteopenia and mild degenerative changes thoracic spine. Degenerative changes bilateral shoulders. IMPRESSION: No active disease. Status post CABG. Mild basilar scarring. Osteopenia and mild degenerative changes thoracic spine. Electronically Signed   By: Natasha MeadLiviu  Pop M.D.   On: 04-29-16 15:53   Nm Myocar Multi W/spect W/wall Motion / Ef  08/18/2015   No T wave inversion was noted during stress.  There was no ST segment deviation noted during stress.  The left ventricular ejection fraction is moderately decreased (30-44%).  This is an intermediate risk study.  1. EF 37% with diffuse hypokinesis, looks worst in the septum. 2. There is a medium-sized, severe  fixed inferior perfusion defect consistent with prior infarction.  There is a reversible small but severe apical anterior/apical septal perfusion defect that is suggestive of ischemia.  3. Intermediate risk study.   Dg Chest Port 1 View  08/18/2015  CLINICAL DATA:  Right-sided PICC line insertion. EXAM: PORTABLE CHEST 1 VIEW COMPARISON:  08/22/2015 FINDINGS: Fourteen 35 hrs. Hyperexpansion is consistent with emphysema. Interstitial markings are diffusely coarsened with chronic features. Architectural distortion noted in the lungs bilaterally. Right PICC line is new in the interval with tip overlying the distal SVC level. Patient is  status post CABG. Telemetry leads overlie the chest. IMPRESSION: Right PICC line tip overlies the distal SVC. No acute cardiopulmonary findings. Electronically Signed   By: Kennith Center M.D.   On: 08/18/2015 15:00   Ct Angio Abd/pel W/ And/or W/o  09/08/2015  CLINICAL DATA:  Nausea.  Constipation. EXAM: CTA ABDOMEN AND PELVIS wITHOUT AND WITH CONTRAST TECHNIQUE: Multidetector CT imaging of the abdomen and pelvis was performed using the standard protocol during bolus administration of intravenous contrast. Multiplanar reconstructed images and MIPs were obtained and reviewed to evaluate the vascular anatomy. CONTRAST:  80mL OMNIPAQUE IOHEXOL 350 MG/ML SOLN COMPARISON:  None. FINDINGS: There is a very large infrarenal abdominal aortic aneurysm. It is bilobed. Maximal AP and transverse diameters 9.7 and 10.1 cm respectively. The blood pool is poorly opacified within the aneurysm and iliac vasculature due to large volume. There is no extravasated contrast to suggest ruptured aneurysm. Mild narrowing of the celiac due to median arcuate ligament syndrome SMA is patent with mild atherosclerotic disease at its origin. Two right renal arteries and a single left renal artery are diminutive and patent. IMA origin is occluded.  Branches reconstitute Right common iliac artery aneurysm is 4.1 cm in caliber. Left common iliac artery aneurysm is 1.7 cm in caliber. External iliac arteries are poorly opacified. Diffuse atherosclerotic changes of the external iliac arteries are patent without obvious focal severe narrowing. Severe emphysema towards the lung bases is present. There are multi focal indeterminate pulmonary opacities at the lung base. 1.6 cm more central left lower lobe parenchymal abnormality on image 5. There is a more patchy appearing opacity in the lingula measuring 17 mm on image 6. Tiny right lower lobe abnormality measures 3 mm on image 5. Patchy pulmonary opacities in the posterior and lateral left lung base on  image 10 Diffuse hepatic steatosis. Gallbladder, spleen, pancreas, and adrenal glands are within normal limits. There is nonspecific perinephric stranding bilaterally worse on the left. There is significant wall thickening and wall edema involving the distal half of the descending colon, sigmoid colon, and rectum. There are significant inflammatory changes in the perirectal fat. No pneumatosis. No extraluminal bowel gas. No sign of significant diverticulosis in this portion of the colon. Right inguinal hernia contains adipose tissue. Review of the MIP images confirms the above findings. IMPRESSION: Very large infrarenal abdominal aortic aneurysm with a maximal diameter of 10.1 cm. Right common iliac artery aneurysm with a maximal caliber a 4.1 cm There is extensive wall thickening of the distal colon in a continuous fashion beginning in the descending colon and extending to the rectum with associated inflammatory changes in the perirectal fat. Differential diagnosis includes inflammatory bowel disease, infectious colitis, pseudomembranous colitis, and ischemia. Electronically Signed   By: Jolaine Click M.D.   On: 08/21/2015 17:26    Joanna Puff, MD 08/20/2015, 12:33 AM PGY-2, [facility] FPTS Intern pager: [phone], text pages welcome

## 2015-08-20 NOTE — Progress Notes (Addendum)
PARENTERAL NUTRITION CONSULT NOTE - FOLLOW UP  Pharmacy Consult for TPN Indication: Ischemic bowel  Allergies  Allergen Reactions  . Penicillins   . Sulindac     Patient Measurements: Height:  (170.2 cm) Weight: 156 lb 1.4 oz (70.8 kg) IBW/kg (Calculated) : 66.1 Adjusted Body Weight:  Usual Weight:   Vital Signs: Temp: 98.9 F (37.2 C) (03/12 0750) Temp Source: Oral (03/12 0750) BP: 114/80 mmHg (03/12 0750) Pulse Rate: 116 (03/12 0750) Intake/Output from previous day: 03/11 0701 - 03/12 0700 In: 2901.7 [I.V.:1600; IV Piggyback:806.7; TPN:495] Out: 1200 [Urine:1200] Intake/Output from this shift: Total I/O In: -  Out: 200 [Urine:200]  Labs:  Recent Labs  08/18/15 0912 08/19/15 0350 08/20/15 0455  WBC 13.8* 12.9* 14.0*  HGB 8.7* 8.3* 8.4*  HCT 26.4* 26.6* 26.8*  PLT 98* 110* 121*     Recent Labs  08/18/15 0912 08/19/15 0350 08/19/15 0900 08/20/15 0455  NA 134* 135  --  132*  K 3.7 3.4*  --  3.3*  CL 102 103  --  103  CO2 25 25  --  24  GLUCOSE 129* 133*  --  161*  BUN 11 8  --  8  CREATININE 0.82 0.77  --  0.69  CALCIUM 8.1* 8.1*  --  7.9*  MG  --  1.7  --  1.7  PHOS  --  1.9*  --   --   PROT  --  5.5*  --   --   ALBUMIN  --  1.9*  --   --   AST  --  24  --   --   ALT  --  15*  --   --   ALKPHOS  --  36*  --   --   BILITOT  --  0.6  --   --   PREALBUMIN  --   --  4.8*  --   TRIG  --  50  --  50  CHOLHDL  --   --   --  2.7  CHOL  --   --   --  65   Estimated Creatinine Clearance: 65.4 mL/min (by C-G formula based on Cr of 0.69).    Recent Labs  08/20/15 0008 08/20/15 0337 08/20/15 0805  GLUCAP 141* 153* 131*    Medications:  Scheduled:  . antiseptic oral rinse  7 mL Mouth Rinse BID  . atropine  1 drop Right Eye BID  . Chlorhexidine Gluconate Cloth  6 each Topical Q0600  . gabapentin  400 mg Oral TID  . guaiFENesin  600 mg Oral BID  . insulin aspart  0-9 Units Subcutaneous 6 times per day  . ipratropium-albuterol  3 mL  Nebulization QID  . levofloxacin (LEVAQUIN) IV  750 mg Intravenous Q24H  . lisinopril  10 mg Oral Daily  . magnesium sulfate 1 - 4 g bolus IVPB  2 g Intravenous Once  . metoprolol  2.5 mg Intravenous 4 times per day  . metronidazole  500 mg Intravenous 3 times per day  . mometasone-formoterol  2 puff Inhalation BID  . mupirocin ointment  1 application Nasal BID  . potassium chloride  10 mEq Intravenous Q1 Hr x 3  . pravastatin  40 mg Oral q1800  . regadenoson  0.4 mg Intravenous Once  . saccharomyces boulardii  250 mg Oral BID  . sodium chloride flush  10-40 mL Intracatheter Q12H  . sodium chloride flush  3 mL Intravenous Q12H    Insulin  Requirements in the past 24 hours:  3 units Sens SSI  Current Nutrition:  NPO  Assessment: 82yo male admitted 3/7 with bloody stools and suspected colitis. CT at that time (+)massive AAA and decreasing Hg over first 24hr. Biopsies from sigmoidoscopy on 3/9 are c/w ischemic colitis; BMs are no longer bloody reflecting resolving ischemia. He is to start TPN for continued bowel rest.  Surgeries/Procedures: 3/9- Sigmoidoscopy For AAA repair in the coming week  GI: NPO since 3/7. Ischemic bowel. Rectal tube for frequent non-bloody BMs. CDiff & Enteric pathogen panel negative.  GI signed off Endo: No hx DM. Serum gluc 133. Currently on no SSI Lytes: K 3.3, Mg 1.7, Ca 8.1 (CoCa 9.8) Renal: Cr 0.77. UOP 0.89ml/kg/hr, urinary retention (bladder scan > 600mL). I/O 2.2L. D5NS at 13025ml/hr Pulm: Hx COPD, on 2L at home. DuoNeb, Dulera Cards: BP wnl, tachy. Lisinopril, Metoprolol IV, Pravastatin (not receiving po meds consistently) Hepatobil: LFTs wnl/low. TG 50 ID: Metronidazole and Levofloxacin for ischemic colitis. AFeb. WBC 14  Best Practices: SCDs TPN Access: PICC 3/10 TPN start date: 3/11 >>  Nutritional Goals:  1750-2000 kCal, 85-100 grams of protein per day Goal rate 3580ml/hr to provide 1840kcal and 96g  protein  Plan:  Increase Clinimix-E 5/15 to 2660ml/hr Decrease IVF to 1860ml/hr when TPN hung, and further decrease rate as TPN advanced Mg 2g IV x 1 K-runs x 3 already given this AM Sensitive SSI q4 TPN labs in AM  Marisue HumbleKendra Deniese Oberry, PharmD Clinical Pharmacist [facility]

## 2015-08-20 NOTE — Progress Notes (Signed)
Pt c/o some mild SOB during night but with more congestion in upper airway and patient having more frequent cough. Lungs sounds diminished.  Pt states he can not get anything up. Dr on call made aware and pt given albuterol neb. Day shift RN updated.

## 2015-08-21 ENCOUNTER — Inpatient Hospital Stay (HOSPITAL_COMMUNITY): Payer: Medicaid Other

## 2015-08-21 DIAGNOSIS — A419 Sepsis, unspecified organism: Secondary | ICD-10-CM

## 2015-08-21 DIAGNOSIS — J9601 Acute respiratory failure with hypoxia: Secondary | ICD-10-CM

## 2015-08-21 DIAGNOSIS — R6521 Severe sepsis with septic shock: Secondary | ICD-10-CM

## 2015-08-21 DIAGNOSIS — J9602 Acute respiratory failure with hypercapnia: Secondary | ICD-10-CM

## 2015-08-21 DIAGNOSIS — J80 Acute respiratory distress syndrome: Secondary | ICD-10-CM

## 2015-08-21 DIAGNOSIS — G9340 Encephalopathy, unspecified: Secondary | ICD-10-CM

## 2015-08-21 LAB — POCT I-STAT 3, ART BLOOD GAS (G3+)
Acid-base deficit: 6 mmol/L — ABNORMAL HIGH (ref 0.0–2.0)
BICARBONATE: 27.8 meq/L — AB (ref 20.0–24.0)
BICARBONATE: 20.4 meq/L (ref 20.0–24.0)
O2 Saturation: 100 %
O2 Saturation: 97 %
PCO2 ART: 63.1 mmHg — AB (ref 35.0–45.0)
PCO2 ART: 42.1 mmHg (ref 35.0–45.0)
Patient temperature: 98.6
TCO2: 30 mmol/L (ref 0–100)
TCO2: 22 mmol/L (ref 0–100)
pH, Arterial: 7.252 — ABNORMAL LOW (ref 7.350–7.450)
pH, Arterial: 7.293 — ABNORMAL LOW (ref 7.350–7.450)
pO2, Arterial: 353 mmHg — ABNORMAL HIGH (ref 80.0–100.0)
pO2, Arterial: 95 mmHg (ref 80.0–100.0)

## 2015-08-21 LAB — TRIGLYCERIDES: Triglycerides: 59 mg/dL (ref <150)

## 2015-08-21 LAB — COMPREHENSIVE METABOLIC PANEL
ALBUMIN: 2 g/dL — AB (ref 3.5–5.0)
ALK PHOS: 46 U/L (ref 38–126)
ALT: 13 U/L — ABNORMAL LOW (ref 17–63)
AST: 21 U/L (ref 15–41)
Anion gap: 9 (ref 5–15)
BILIRUBIN TOTAL: 0.5 mg/dL (ref 0.3–1.2)
BUN: 14 mg/dL (ref 6–20)
CALCIUM: 8.2 mg/dL — AB (ref 8.9–10.3)
CO2: 25 mmol/L (ref 22–32)
Chloride: 99 mmol/L — ABNORMAL LOW (ref 101–111)
Creatinine, Ser: 0.66 mg/dL (ref 0.61–1.24)
GFR calc Af Amer: >60 mL/min (ref >60)
GLUCOSE: 191 mg/dL — AB (ref 65–99)
POTASSIUM: 4.1 mmol/L (ref 3.5–5.1)
Sodium: 133 mmol/L — ABNORMAL LOW (ref 135–145)
TOTAL PROTEIN: 5.9 g/dL — AB (ref 6.5–8.1)

## 2015-08-21 LAB — BLOOD GAS, ARTERIAL
Acid-Base Excess: 0.6 mmol/L (ref 0.0–2.0)
Bicarbonate: 26.8 mEq/L — ABNORMAL HIGH (ref 20.0–24.0)
Drawn by: 41308
FIO2: 100
O2 SAT: 99.1 %
PH ART: 7.272 — AB (ref 7.350–7.450)
Patient temperature: 98.6
TCO2: 28.6 mmol/L (ref 0–100)
pCO2 arterial: 60 mmHg (ref 35.0–45.0)
pO2, Arterial: 178 mmHg — ABNORMAL HIGH (ref 80.0–100.0)

## 2015-08-21 LAB — LACTIC ACID, PLASMA
LACTIC ACID, VENOUS: 1.5 mmol/L (ref 0.5–2.0)
LACTIC ACID, VENOUS: 1.5 mmol/L (ref 0.5–2.0)

## 2015-08-21 LAB — CULTURE, BLOOD (ROUTINE X 2)
CULTURE: NO GROWTH
Culture: NO GROWTH

## 2015-08-21 LAB — CBC WITH DIFFERENTIAL/PLATELET
BASOS ABS: 0 10*3/uL (ref 0.0–0.1)
Basophils Relative: 0 %
EOS PCT: 0 %
Eosinophils Absolute: 0 10*3/uL (ref 0.0–0.7)
HEMATOCRIT: 29.1 % — AB (ref 39.0–52.0)
Hemoglobin: 8.7 g/dL — ABNORMAL LOW (ref 13.0–17.0)
LYMPHS ABS: 1.3 10*3/uL (ref 0.7–4.0)
Lymphocytes Relative: 7 %
MCH: 26.5 pg (ref 26.0–34.0)
MCHC: 29.9 g/dL — ABNORMAL LOW (ref 30.0–36.0)
MCV: 88.7 fL (ref 78.0–100.0)
MONOS PCT: 5 %
Monocytes Absolute: 1 10*3/uL (ref 0.1–1.0)
NEUTROS PCT: 88 %
Neutro Abs: 16.7 10*3/uL — ABNORMAL HIGH (ref 1.7–7.7)
PLATELETS: 151 10*3/uL (ref 150–400)
RBC: 3.28 MIL/uL — AB (ref 4.22–5.81)
RDW: 14 % (ref 11.5–15.5)
WBC: 19 10*3/uL — AB (ref 4.0–10.5)

## 2015-08-21 LAB — HEPATITIS PANEL, ACUTE
HCV Ab: 0.1 s/co ratio (ref 0.0–0.9)
Hep A IgM: NEGATIVE
Hep B C IgM: NEGATIVE
Hepatitis B Surface Ag: NEGATIVE

## 2015-08-21 LAB — GLUCOSE, CAPILLARY
GLUCOSE-CAPILLARY: 152 mg/dL — AB (ref 65–99)
GLUCOSE-CAPILLARY: 159 mg/dL — AB (ref 65–99)
GLUCOSE-CAPILLARY: 161 mg/dL — AB (ref 65–99)
Glucose-Capillary: 182 mg/dL — ABNORMAL HIGH (ref 65–99)

## 2015-08-21 LAB — GONOCOCCUS CULTURE

## 2015-08-21 LAB — MAGNESIUM: MAGNESIUM: 1.9 mg/dL (ref 1.7–2.4)

## 2015-08-21 LAB — PHOSPHORUS: Phosphorus: 2.3 mg/dL — ABNORMAL LOW (ref 2.5–4.6)

## 2015-08-21 LAB — PREALBUMIN: Prealbumin: 5.5 mg/dL — ABNORMAL LOW (ref 18–38)

## 2015-08-21 MED ORDER — IOHEXOL 350 MG/ML SOLN
100.0000 mL | Freq: Once | INTRAVENOUS | Status: AC | PRN
  Administered 2015-08-21: 100 mL via INTRAVENOUS

## 2015-08-21 MED ORDER — ETOMIDATE 2 MG/ML IV SOLN
20.0000 mg | Freq: Once | INTRAVENOUS | Status: AC
  Administered 2015-08-21: 20 mg via INTRAVENOUS

## 2015-08-21 MED ORDER — ROCURONIUM BROMIDE 50 MG/5ML IV SOLN
50.0000 mg | Freq: Once | INTRAVENOUS | Status: AC
  Administered 2015-08-21: 50 mg via INTRAVENOUS
  Filled 2015-08-21: qty 5

## 2015-08-21 MED ORDER — PANTOPRAZOLE SODIUM 40 MG IV SOLR
40.0000 mg | INTRAVENOUS | Status: DC
  Administered 2015-08-21 – 2015-08-24 (×4): 40 mg via INTRAVENOUS
  Filled 2015-08-21 (×5): qty 40

## 2015-08-21 MED ORDER — SODIUM CHLORIDE 0.9 % IV SOLN
INTRAVENOUS | Status: DC
  Administered 2015-08-21 – 2015-08-22 (×2): via INTRAVENOUS

## 2015-08-21 MED ORDER — ANTISEPTIC ORAL RINSE SOLUTION (CORINZ)
7.0000 mL | Freq: Four times a day (QID) | OROMUCOSAL | Status: DC
  Administered 2015-08-21 – 2015-08-25 (×16): 7 mL via OROMUCOSAL

## 2015-08-21 MED ORDER — FENTANYL CITRATE (PF) 100 MCG/2ML IJ SOLN
25.0000 ug | INTRAMUSCULAR | Status: DC | PRN
Start: 2015-08-21 — End: 2015-08-25
  Administered 2015-08-21 – 2015-08-22 (×7): 50 ug via INTRAVENOUS
  Filled 2015-08-21 (×6): qty 2

## 2015-08-21 MED ORDER — MIDAZOLAM HCL 2 MG/2ML IJ SOLN
INTRAMUSCULAR | Status: AC
  Administered 2015-08-21: 2 mg via INTRAVENOUS
  Filled 2015-08-21: qty 4

## 2015-08-21 MED ORDER — SODIUM CHLORIDE 0.9 % IV SOLN
INTRAVENOUS | Status: DC
  Administered 2015-08-21 – 2015-08-24 (×3): via INTRAVENOUS
  Administered 2015-08-24: 100 mL/h via INTRAVENOUS

## 2015-08-21 MED ORDER — NOREPINEPHRINE BITARTRATE 1 MG/ML IV SOLN
0.0000 ug/min | INTRAVENOUS | Status: DC
  Administered 2015-08-21: 10 ug/min via INTRAVENOUS
  Filled 2015-08-21: qty 4

## 2015-08-21 MED ORDER — TRACE MINERALS CR-CU-MN-SE-ZN 10-1000-500-60 MCG/ML IV SOLN: INTRAVENOUS | Status: DC

## 2015-08-21 MED ORDER — FAT EMULSION 20 % IV EMUL
240.0000 mL | INTRAVENOUS | Status: AC
  Administered 2015-08-21: 240 mL via INTRAVENOUS
  Filled 2015-08-21: qty 250

## 2015-08-21 MED ORDER — PHENYLEPHRINE HCL 10 MG/ML IJ SOLN
0.0000 ug/min | INTRAVENOUS | Status: DC
  Administered 2015-08-21: 200 ug/min via INTRAVENOUS
  Administered 2015-08-21: 160 ug/min via INTRAVENOUS
  Administered 2015-08-21: 50 ug/min via INTRAVENOUS
  Administered 2015-08-21: 160 ug/min via INTRAVENOUS
  Administered 2015-08-22: 120 ug/min via INTRAVENOUS
  Administered 2015-08-22: 30 ug/min via INTRAVENOUS
  Administered 2015-08-23: 25 ug/min via INTRAVENOUS
  Filled 2015-08-21 (×8): qty 4

## 2015-08-21 MED ORDER — PREDNISONE 50 MG PO TABS
50.0000 mg | ORAL_TABLET | Freq: Every day | ORAL | Status: DC
  Filled 2015-08-21: qty 1

## 2015-08-21 MED ORDER — METHYLPREDNISOLONE SODIUM SUCC 40 MG IJ SOLR
40.0000 mg | Freq: Four times a day (QID) | INTRAMUSCULAR | Status: AC
  Administered 2015-08-21 (×3): 40 mg via INTRAVENOUS
  Filled 2015-08-21 (×3): qty 1

## 2015-08-21 MED ORDER — FENTANYL CITRATE (PF) 100 MCG/2ML IJ SOLN
100.0000 ug | Freq: Once | INTRAMUSCULAR | Status: AC
Start: 2015-08-21 — End: 2015-08-21
  Administered 2015-08-21: 100 ug via INTRAVENOUS

## 2015-08-21 MED ORDER — MIDAZOLAM HCL 2 MG/2ML IJ SOLN
2.0000 mg | Freq: Once | INTRAMUSCULAR | Status: AC
  Administered 2015-08-21: 2 mg via INTRAVENOUS

## 2015-08-21 MED ORDER — TRACE MINERALS CR-CU-MN-SE-ZN 10-1000-500-60 MCG/ML IV SOLN
INTRAVENOUS | Status: AC
  Administered 2015-08-21: 19:00:00 via INTRAVENOUS
  Filled 2015-08-21: qty 1920

## 2015-08-21 MED ORDER — CHLORHEXIDINE GLUCONATE 0.12% ORAL RINSE (MEDLINE KIT)
15.0000 mL | Freq: Two times a day (BID) | OROMUCOSAL | Status: DC
  Administered 2015-08-21 – 2015-08-25 (×9): 15 mL via OROMUCOSAL

## 2015-08-21 MED ORDER — FENTANYL CITRATE (PF) 100 MCG/2ML IJ SOLN
INTRAMUSCULAR | Status: AC
  Administered 2015-08-21: 100 ug via INTRAVENOUS
  Filled 2015-08-21: qty 4

## 2015-08-21 MED ORDER — MIDAZOLAM HCL 2 MG/2ML IJ SOLN: 1.0000 mg | INTRAMUSCULAR | Status: DC | PRN

## 2015-08-21 MED ORDER — SODIUM CHLORIDE 0.9 % IV BOLUS (SEPSIS)
1000.0000 mL | Freq: Once | INTRAVENOUS | Status: AC
  Administered 2015-08-21: 1000 mL via INTRAVENOUS

## 2015-08-21 MED ORDER — FAT EMULSION 20 % IV EMUL: 240.0000 mL | INTRAVENOUS | Status: DC

## 2015-08-21 NOTE — Progress Notes (Signed)
PARENTERAL NUTRITION CONSULT NOTE - FOLLOW UP  Pharmacy Consult for TPN Indication: Ischemic bowel  Allergies  Allergen Reactions  . Penicillins   . Sulindac     Patient Measurements: Height:  (170.2 cm) Weight: 156 lb 1.4 oz (70.8 kg) IBW/kg (Calculated) : 66.1  Vital Signs: Temp: 98.6 F (37 C) (03/13 0352) Temp Source: Oral (03/13 0352) BP: 147/100 mmHg (03/13 0200) Pulse Rate: 122 (03/13 0200) Intake/Output from previous day: 03/12 0701 - 03/13 0700 In: 1370 [P.O.:170; I.V.:850] Out: 950 [Urine:950] Intake/Output from this shift:    Labs:  Recent Labs  08/18/15 0912 08/19/15 0350 08/20/15 0455  WBC 13.8* 12.9* 14.0*  HGB 8.7* 8.3* 8.4*  HCT 26.4* 26.6* 26.8*  PLT 98* 110* 121*     Recent Labs  08/19/15 0350 08/19/15 0900 08/20/15 0455 08/21/15 0510  NA 135  --  132* 133*  K 3.4*  --  3.3* 4.1  CL 103  --  103 99*  CO2 25  --  24 25  GLUCOSE 133*  --  161* 191*  BUN 8  --  8 14  CREATININE 0.77  --  0.69 0.66  CALCIUM 8.1*  --  7.9* 8.2*  MG 1.7  --  1.7 1.9  PHOS 1.9*  --   --  2.3*  PROT 5.5*  --   --  5.9*  ALBUMIN 1.9*  --   --  2.0*  AST 24  --   --  21  ALT 15*  --   --  13*  ALKPHOS 36*  --   --  46  BILITOT 0.6  --   --  0.5  PREALBUMIN  --  4.8*  --  5.5*  TRIG 50  --  50 59  CHOLHDL  --   --  2.7  --   CHOL  --   --  65  --    Estimated Creatinine Clearance: 65.4 mL/min (by C-G formula based on Cr of 0.66).    Recent Labs  08/20/15 1952 08/20/15 2314 08/21/15 0354  GLUCAP 132* 152* 159*    Medications:  Scheduled:  . antiseptic oral rinse  7 mL Mouth Rinse BID  . atropine  1 drop Right Eye BID  . gabapentin  400 mg Oral TID  . guaiFENesin  600 mg Oral BID  . insulin aspart  0-9 Units Subcutaneous 6 times per day  . ipratropium-albuterol  3 mL Nebulization QID  . levofloxacin (LEVAQUIN) IV  750 mg Intravenous Q24H  . lisinopril  10 mg Oral Daily  . metoprolol  5 mg Intravenous 4 times per day  . metronidazole   500 mg Intravenous 3 times per day  . mometasone-formoterol  2 puff Inhalation BID  . mupirocin ointment  1 application Nasal BID  . pravastatin  40 mg Oral q1800  . predniSONE  50 mg Oral Q breakfast  . regadenoson  0.4 mg Intravenous Once  . saccharomyces boulardii  250 mg Oral BID  . sodium chloride flush  10-40 mL Intracatheter Q12H  . sodium chloride flush  3 mL Intravenous Q12H    Insulin Requirements in the past 24 hours:  6 units Sens SSI in the last 24 hrs  Current Nutrition:  NPO  Assessment: 82yo male admitted 3/7 with bloody stools and suspected colitis. CT at that time (+)massive AAA and decreasing Hg over first 24hr. Biopsies from sigmoidoscopy on 3/9 are c/w ischemic colitis; BMs are no longer bloody reflecting resolving ischemia. He  is to start TPN for continued bowel rest.  Surgeries/Procedures: 3/9- Sigmoidoscopy For AAA repair in the coming week  GI: NPO since 3/7 for Ischemic bowel. Rectal tube for frequent non-bloody BMs. CDiff & enteric pathogen panel negative.  GI signed off. No further stools noted in flow sheet. Endo: No hx DM. Serum gluc 191.CBGs 131-159/24h Lytes: K 4.1 replaced, Mg 1.9 up, Ca 8.2 (CoCa 9.8), Phos 1.9>2.3 slightly low. Renal: Cr 0.66. UOP 0.196ml/kg/hr (down), urinary retention (bladder scan > 600mL). I/O 420cc. D5NS at 2560ml/hr (1440 ml/day) + TPN 163580ml/hr currently. Pulm: Hx COPD, on 2L at home. DuoNeb, Dulera, Mucinex. Trf to ICU 3/13 for change in respiratory status with high risk for intubation. Cards: BP elevated, tachy. Lisinopril, Metoprolol IV, Pravastatin (not receiving po meds consistently), IV hydralazine prn Hepatobil: LFTs wnl/low. TG 59 ok ID: Metronidazole and Levofloxacin for ischemic colitis + Florastor. AFeb. WBC 14  Best Practices: SCDs, MC TPN Access: PICC 3/10 TPN start date: 3/11 >>  Nutritional Goals:  1750-2000 kCal, 85-100 grams of protein per day Goal rate 2180ml/hr to  provide 1840kcal and 96g protein  Plan:  Transferred to 22M on 3/13 Increase Clinimix-E 5/15 to 7980ml/hr (1920 ml/day) + multivitamins + trace elements IV lipids 3810ml/hr (25740ml/day) Change IVF to NS at kvo (23940ml/day) Sensitive SSI q4

## 2015-08-21 NOTE — Procedures (Signed)
Intubation Procedure Note Jama FlavorsWillard Stradley [id number] 08/03/1929  Procedure: Intubation Indications: Respiratory insufficiency  Procedure Details Consent: Unable to obtain consent because of emergent medical necessity. Time Out: Verified patient identification, verified procedure, site/side was marked, verified correct patient position, special equipment/implants available, medications/allergies/relevent history reviewed, required imaging and test results available.  Performed  Maximum sterile technique was used including gloves, hand hygiene and mask.  MAC    Evaluation Hemodynamic Status: BP stable throughout; O2 sats: stable throughout Patient's Current Condition: stable Complications: No apparent complications Patient did tolerate procedure well. Chest X-ray ordered to verify placement.  CXR: pending.   Koren BoundYACOUB,WESAM 08/21/2015

## 2015-08-21 NOTE — Progress Notes (Signed)
Arterial catheter was placed by Dr Molli KnockYacoub. RT will continue to monitor.

## 2015-08-21 NOTE — Progress Notes (Signed)
Pt placed on NRB to support oxygen saturation. Pt having labored breathing and rhonchi. Prn resp nebs given and  MD paged with orders given and carried out.  EKG obtained and noticed that knees appeared mottled.  MD notified with further orders placed and carried out.  Will continue to monitor patient and report any new changes.

## 2015-08-21 NOTE — Progress Notes (Signed)
Called to bedside to assess pt for increased WOB and increased o2 demands, was on 2lpm [place] at start of night shift and now on 100% NRB. Rapid Response at bedside and paged CCM will await further orders.

## 2015-08-21 NOTE — Procedures (Signed)
OGT Placement  OGT placed under direct laryngoscopy and verified by auscultation  Tyrian Peart G. Sherill Mangen, M.D. [facility]. Pager: [phone]. After hours pager: [phone]. 

## 2015-08-21 NOTE — Procedures (Signed)
Attempted a line.  Unable to thread left radial d/t vasospasm.    Simonne MartinetPeter E Nikol Lemar ACNP-BC [facility] Pager # [phone] OR # [phone] if no answer

## 2015-08-21 NOTE — Progress Notes (Signed)
Upon morning report seen that patient was having significant increase in work of breathing with use of accessory muscle use. Respiratory rate was in the mid 30's to low 40's, heart rate was also elevated in the 130"s, blood pressure was initially within defined limits but began to decrease. Rapid response was called as well as primary MD. Orders received later to transfer pt to ICU for intubation. Bedside report given to receiving nurse and all question answered.

## 2015-08-21 NOTE — Consult Note (Addendum)
PULMONARY / CRITICAL CARE MEDICINE   Name: Micheal Orozco MRN: [id number] DOB: 18-Jun-1929    ADMISSION DATE:  08/29/2015 CONSULTATION DATE:  08/21/2015  REFERRING MD:  Danise Edge  CHIEF COMPLAINT:  VDRF  HISTORY OF PRESENT ILLNESS:   82 year old male with extensive PMH who presents to the hospital with multiple complaints to include AAA that VVS feels need to be operated on when more stable, GI bleeding that was evaluated by GI and bx consistent with ischemic colitis and CP that was evaluated by CVTS and recommended repair of the AAA via VVS.  On 3/13, patient developed acute hypercarbic respiratory failure likely acute exacerbation of COPD with all the associated inflammatory issues due to ischemic colitis.  PCCM was consulted and patient was immediately intubated.  There is no contact family numbers for patient.  So full code for now.  PAST MEDICAL HISTORY :  He  has a past medical history of COPD (chronic obstructive pulmonary disease) (HCC) and Renal insufficiency.  PAST SURGICAL HISTORY: He  has past surgical history that includes triple bypass (1995) and Flexible sigmoidoscopy (N/A, 08/21/2015).  Allergies  Allergen Reactions  . Penicillins   . Sulindac    No current facility-administered medications on file prior to encounter.   No current outpatient prescriptions on file prior to encounter.    FAMILY HISTORY:  His has no family status information on file.   SOCIAL HISTORY: He  reports that he quit smoking about 22 years ago. His smoking use included Cigarettes. He does not have any smokeless tobacco history on file. He reports that he does not drink alcohol or use illicit drugs.  REVIEW OF SYSTEMS:   Unattainable, patient is encephalopathic.  SUBJECTIVE:  Patient is encephalopathic, unable to provide any history.  VITAL SIGNS: BP 147/100 mmHg  Pulse 122  Temp(Src) 98.6 F (37 C) (Oral)  Resp 28  Ht 5\' 7"  (1.702 m)  Wt 70.8 kg (156 lb 1.4 oz)  BMI 24.44 kg/m2  SpO2  87%  HEMODYNAMICS:    VENTILATOR SETTINGS:    INTAKE / OUTPUT: I/O last 3 completed shifts: In: 2915 [P.O.:170; I.V.:1700; Other:350; IV Piggyback:200] Out: 1650 [Urine:1650]  PHYSICAL EXAMINATION: General:  Chronically ill appearing male, acute respiratory distress. Neuro:  Moving all ext spontaneously and to pain but not command. HEENT:  /AT, PERRL on left, no eye in right and DMM> Cardiovascular:  Regular, sinus tach, Nl S1/S2 and -M/R/G. Lungs:  Diffuse wheezes, prolonged exp phase. Abdomen:  Soft, diffusely tender, ND and +BS. Musculoskeletal:  -edema and -tenderness. Skin:  Intact, scars noted.  LABS:  BMET  Recent Labs Lab 08/19/15 0350 08/20/15 0455 08/21/15 0510  NA 135 132* 133*  K 3.4* 3.3* 4.1  CL 103 103 99*  CO2 25 24 25   BUN 8 8 14   CREATININE 0.77 0.69 0.66  GLUCOSE 133* 161* 191*    Electrolytes  Recent Labs Lab 08/19/15 0350 08/20/15 0455 08/21/15 0510  CALCIUM 8.1* 7.9* 8.2*  MG 1.7 1.7 1.9  PHOS 1.9*  --  2.3*    CBC  Recent Labs Lab 08/18/15 0912 08/19/15 0350 08/20/15 0455  WBC 13.8* 12.9* 14.0*  HGB 8.7* 8.3* 8.4*  HCT 26.4* 26.6* 26.8*  PLT 98* 110* 121*    Coag's  Recent Labs Lab 08/30/2015 2155  INR 1.40    Sepsis Markers  Recent Labs Lab 08/13/2015 2240 08/09/2015 0102 08/21/15 0555  LATICACIDVEN 2.1* 0.9 1.5    ABG  Recent Labs Lab 08/21/15 0545  PHART  7.272*  PCO2ART 60.0*  PO2ART 178*    Liver Enzymes  Recent Labs Lab 08/09/2015 0438 08/19/15 0350 08/21/15 0510  AST 70* 24 21  ALT 21 15* 13*  ALKPHOS 58 36* 46  BILITOT 0.9 0.6 0.5  ALBUMIN 2.2* 1.9* 2.0*    Cardiac Enzymes  Recent Labs Lab 08/11/2015 2240 09/01/2015 0438 09/03/2015 1032  TROPONINI 0.08* 0.09* 0.06*    Glucose  Recent Labs Lab 08/20/15 0805 08/20/15 1152 08/20/15 1631 08/20/15 1952 08/20/15 2314 08/21/15 0354  GLUCAP 131* 136* 134* 132* 152* 159*    Imaging Dg Chest 1 View  08/20/2015  CLINICAL DATA:   Shortness of breath, tachypnea, COPD EXAM: CHEST 1 VIEW COMPARISON:  08/18/2015.  Films dating back to 08/24/2015 FINDINGS: There is hyperinflation of the lungs compatible with COPD. Heart is normal size. Prior CABG. Areas of scarring in the lungs bilaterally left perihilar and lower lobe airspace opacity noted, increased since 08/26/2015. This could reflect infiltrate/pneumonia. Trace bilateral effusions. Mild right basilar atelectasis or infiltrate. IMPRESSION: COPD. Increasing left perihilar and bibasilar airspace opacities which could reflect atelectasis or infiltrates. Trace effusions bilaterally. Electronically Signed   By: Charlett NoseKevin  Dover M.D.   On: 08/20/2015 10:29   Dg Chest Port 1 View  08/21/2015  CLINICAL DATA:  Hypoxia EXAM: PORTABLE CHEST 1 VIEW COMPARISON:  08/20/2015 FINDINGS: Cardiac shadow is stable. Postoperative changes are again seen. Right-sided PICC line is noted in satisfactory position. Patchy opacities are noted bilaterally slightly increased in the bases when compared with the prior exam. IMPRESSION: Increased patchy infiltrate in the bases bilaterally. Electronically Signed   By: Alcide CleverMark  Lukens M.D.   On: 08/21/2015 08:06   Dg Swallowing Func-speech Pathology  08/20/2015  Objective Swallowing Evaluation: Type of Study: MBS-Modified Barium Swallow Study Patient Details Name: Micheal Orozco MRN: [id number] Date of Birth: 12/28/1929 Today's Date: 08/20/2015 Time: SLP Start Time (ACUTE ONLY): 0945-SLP Stop Time (ACUTE ONLY): 1020 SLP Time Calculation (min) (ACUTE ONLY): 35 min Past Medical History: Past Medical History Diagnosis Date . COPD (chronic obstructive pulmonary disease) (HCC)  . Renal insufficiency  Past Surgical History: Past Surgical History Procedure Laterality Date . Triple bypass  1995 . Flexible sigmoidoscopy N/A 08/09/2015   Procedure: FLEXIBLE SIGMOIDOSCOPY;  Surgeon: Bernette Redbirdobert Buccini, MD;  [facility];  Service: Endoscopy;  Laterality: N/A; HPI: Micheal FlavorsWillard Moultry is a 82  y.o. male presenting with hematochezia 1 day secondary to colitis. Found to have very large abdominal aortic aneurysm with possible plan for surgical intervention. PMH is significant for COPD, CAD, H/o CABG, hypertension, glaucoma, macular degeneration. No Data Recorded Assessment / Plan / Recommendation CHL IP CLINICAL IMPRESSIONS 08/20/2015 Therapy Diagnosis Mild cervical esophageal phase dysphagia;Suspected primary esophageal dysphagia;Mild pharyngeal phase dysphagia Clinical Impression Pt demonstrates mild to moderate dysphagia with a respiratory and esophageal component. Pts respiratory function is labored impacting swallowing by a slight delay in initiation as pt holds bolus to breathe with a shortened apneic period. Sensed frank penetraiton occured before the swallow x1 with thin liquids, though further trials at end of session were adeuqate. Pt appears to have better control of bolus size with a small straw sips as he cannot fully particiapte in holding cup to control bolus for cup sips. In addition to these deficits, pt is noted to have slighlty reduced cervical esophageal opening and esopahgeal sweep reveals diffuse stasis with solids and appearance of distal corkscrew-like contractions (no radiologist present to confirm). Recommend pt initaite a dys 2 (fine chop) diet for energy conservation with thin  liquids with strict esophageal precautions. Pt will be at moderate risk of aspiration with any PO intake due to complexity of impairments.  Impact on safety and function Moderate aspiration risk   CHL IP TREATMENT RECOMMENDATION 08/20/2015 Treatment Recommendations Therapy as outlined in treatment plan below   Prognosis 08/20/2015 Prognosis for Safe Diet Advancement Fair Barriers to Reach Goals -- Barriers/Prognosis Comment -- CHL IP DIET RECOMMENDATION 08/20/2015 SLP Diet Recommendations Dysphagia 2 (Fine chop) solids;Thin liquid Liquid Administration via Straw Medication Administration Whole meds with puree  Compensations Slow rate;Small sips/bites;Follow solids with liquid Postural Changes Seated upright at 90 degrees   CHL IP OTHER RECOMMENDATIONS 08/20/2015 Recommended Consults Consider esophageal assessment Oral Care Recommendations Oral care BID Other Recommendations --   CHL IP FOLLOW UP RECOMMENDATIONS 08/20/2015 Follow up Recommendations 24 hour supervision/assistance   CHL IP FREQUENCY AND DURATION 08/20/2015 Speech Therapy Frequency (ACUTE ONLY) min 2x/week Treatment Duration 2 weeks      CHL IP ORAL PHASE 08/20/2015 Oral Phase Impaired Oral - Pudding Teaspoon -- Oral - Pudding Cup -- Oral - Honey Teaspoon -- Oral - Honey Cup -- Oral - Nectar Teaspoon -- Oral - Nectar Cup Lingual/palatal residue;Reduced posterior propulsion Oral - Nectar Straw Lingual/palatal residue;Reduced posterior propulsion Oral - Thin Teaspoon -- Oral - Thin Cup Reduced posterior propulsion;Lingual/palatal residue Oral - Thin Straw Reduced posterior propulsion;Lingual/palatal residue Oral - Puree WFL Oral - Mech Soft -- Oral - Regular -- Oral - Multi-Consistency -- Oral - Pill WFL Oral Phase - Comment --  CHL IP PHARYNGEAL PHASE 08/20/2015 Pharyngeal Phase Impaired Pharyngeal- Pudding Teaspoon -- Pharyngeal -- Pharyngeal- Pudding Cup -- Pharyngeal -- Pharyngeal- Honey Teaspoon -- Pharyngeal -- Pharyngeal- Honey Cup -- Pharyngeal -- Pharyngeal- Nectar Teaspoon -- Pharyngeal -- Pharyngeal- Nectar Cup Pharyngeal residue - pyriform;Delayed swallow initiation-vallecula;Reduced anterior laryngeal mobility Pharyngeal -- Pharyngeal- Nectar Straw Delayed swallow initiation-vallecula;Pharyngeal residue - pyriform;Reduced anterior laryngeal mobility Pharyngeal -- Pharyngeal- Thin Teaspoon -- Pharyngeal -- Pharyngeal- Thin Cup Delayed swallow initiation-vallecula;Delayed swallow initiation-pyriform sinuses;Penetration/Aspiration before swallow;Reduced anterior laryngeal mobility;Pharyngeal residue - pyriform Pharyngeal Material enters airway, CONTACTS  cords and not ejected out Pharyngeal- Thin Straw Delayed swallow initiation-vallecula;Pharyngeal residue - pyriform;Reduced anterior laryngeal mobility Pharyngeal -- Pharyngeal- Puree -- Pharyngeal -- Pharyngeal- Mechanical Soft -- Pharyngeal -- Pharyngeal- Regular -- Pharyngeal -- Pharyngeal- Multi-consistency -- Pharyngeal -- Pharyngeal- Pill -- Pharyngeal -- Pharyngeal Comment --  No flowsheet data found. No flowsheet data found. Harlon Ditty, [place] CCC-SLP [id number] Claudine Mouton 08/20/2015, 11:31 AM                STUDIES:    CULTURES: C. Diff negative 3/10. GI Panel by PCR negative 3/9. MRSA positive on admission. Blood 3/13>>> STD screen negative. Blood 3/8 negative  ANTIBIOTICS: Flagyl Levaquin  SIGNIFICANT EVENTS: 3/13 intubated.  LINES/TUBES: ETT 3/13>>> Right picc line on admission.  DISCUSSION: 82 year old male with ischemic colitis and AAA who develops a likely COPD exacerbation due to all the inflammatory mediators associated with ischemic colitis that deteriorates on the morning of 3/13 and PCCM was consulted.  Patient was intubated.  ASSESSMENT / PLAN:  PULMONARY A: VDRF due to COPD as above. P:   - Intubate. - Full vent support. - F/U ABG and CXR. - CXR ordered. - Change PO prednisone to IV solumedrol.  CARDIOVASCULAR A:  Septic shock AAA P:  - Levophed >>>change to neo given AAA need lower HR. - IVF 100 ml/hr NS. - CVP q4.  RENAL A:   Hyponatremia P:   - IVF  resuscitation. - Replace electrolytes as indicated. - BMET in AM. - Check Mg and Phos in AM.  GASTROINTESTINAL A:   Ischemic colitis per GI. Diarrhea. C-diff negative. P:   - Flagyl. - GI following. - Defer TF to GI's opinion at this point. - CT of the abdomen and pelvis without contrast.  HEMATOLOGIC A:   Leukocytosis. P:  - CBC in AM. - Transfuse per ICU protocol.  INFECTIOUS A:   C diff negative. Concern for GI bacteria with ischemic colitis. P:   - F/U  on culture. - Levaquin and flagyl.  ENDOCRINE A:   Hyperglycemia   P:   - ISS. - CBG. - Hold TF for now.  NEUROLOGIC A:   AMS due to hypercarbia and acute infection. P:   RASS goal: 0 - Versed PRN. - Fentanyl PRN.  FAMILY  - Updates: No family bedside.  Social worker to find contact information regarding code status discussion.  The patient is critically ill with multiple organ systems failure and requires high complexity decision making for assessment and support, frequent evaluation and titration of therapies, application of advanced monitoring technologies and extensive interpretation of multiple databases.   Critical Care Time devoted to patient care services described in this note is  35  Minutes. This time reflects time of care of this signee Dr Koren Bound. This critical care time does not reflect procedure time, or teaching time or supervisory time of PA/NP/Med student/Med Resident etc but could involve care discussion time.  Alyson Reedy, M.D. [facility] Pulmonary/Critical Care Medicine. Pager: [phone]. After hours pager: [phone].  08/21/2015, 8:28 AM

## 2015-08-21 NOTE — Progress Notes (Signed)
UR Completed. Lilliah Priego, RN, BSN.  [phone] 

## 2015-08-21 NOTE — Progress Notes (Signed)
Subjective: Interval History: Had a significant change in respiratory status this morning. Using accessory muscles for breathing. Respiratory rate nearing 30. Saturations less than 90..   Objective: Vital signs in last 24 hours: Temp:  [98.6 F (37 C)-99.3 F (37.4 C)] 98.6 F (37 C) (03/13 0352) Pulse Rate:  [113-126] 122 (03/13 0200) Resp:  [24-54] 28 (03/13 0200) BP: (124-147)/(80-109) 147/100 mmHg (03/13 0200) SpO2:  [87 %-97 %] 87 % (03/13 0200)  Intake/Output from previous day: 03/12 0701 - 03/13 0700 In: 1370 [P.O.:170; I.V.:850] Out: 950 [Urine:950] Intake/Output this shift:    Alert enough to say that he does not have any abdominal pain. On palpation of his abdomen there is no tenderness.  Lab Results:  Recent Labs  08/19/15 0350 08/20/15 0455  WBC 12.9* 14.0*  HGB 8.3* 8.4*  HCT 26.6* 26.8*  PLT 110* 121*   BMET  Recent Labs  08/20/15 0455 08/21/15 0510  NA 132* 133*  K 3.3* 4.1  CL 103 99*  CO2 24 25  GLUCOSE 161* 191*  BUN 8 14  CREATININE 0.69 0.66  CALCIUM 7.9* 8.2*    Studies/Results: Dg Chest 1 View  08/20/2015  CLINICAL DATA:  Shortness of breath, tachypnea, COPD EXAM: CHEST 1 VIEW COMPARISON:  08/18/2015.  Films dating back to 06-Sep-2015 FINDINGS: There is hyperinflation of the lungs compatible with COPD. Heart is normal size. Prior CABG. Areas of scarring in the lungs bilaterally left perihilar and lower lobe airspace opacity noted, increased since 2015/09/06. This could reflect infiltrate/pneumonia. Trace bilateral effusions. Mild right basilar atelectasis or infiltrate. IMPRESSION: COPD. Increasing left perihilar and bibasilar airspace opacities which could reflect atelectasis or infiltrates. Trace effusions bilaterally. Electronically Signed   By: Charlett Nose M.D.   On: 08/20/2015 10:29   Dg Chest 2 View  Sep 06, 2015  CLINICAL DATA:  Shortness of Breath EXAM: CHEST  2 VIEW COMPARISON:  None. FINDINGS: Cardiomediastinal silhouette is  unremarkable. Status post CABG. No infiltrate or pleural effusion. No pulmonary edema. Mild basilar scarring. Osteopenia and mild degenerative changes thoracic spine. Degenerative changes bilateral shoulders. IMPRESSION: No active disease. Status post CABG. Mild basilar scarring. Osteopenia and mild degenerative changes thoracic spine. Electronically Signed   By: Natasha Mead M.D.   On: 09-06-2015 15:53   Nm Myocar Multi W/spect W/wall Motion / Ef  08/26/2015   No T wave inversion was noted during stress.  There was no ST segment deviation noted during stress.  The left ventricular ejection fraction is moderately decreased (30-44%).  This is an intermediate risk study.  1. EF 37% with diffuse hypokinesis, looks worst in the septum. 2. There is a medium-sized, severe fixed inferior perfusion defect consistent with prior infarction.  There is a reversible small but severe apical anterior/apical septal perfusion defect that is suggestive of ischemia.  3. Intermediate risk study.   Dg Chest Port 1 View  08/18/2015  CLINICAL DATA:  Right-sided PICC line insertion. EXAM: PORTABLE CHEST 1 VIEW COMPARISON:  09-06-15 FINDINGS: Fourteen 35 hrs. Hyperexpansion is consistent with emphysema. Interstitial markings are diffusely coarsened with chronic features. Architectural distortion noted in the lungs bilaterally. Right PICC line is new in the interval with tip overlying the distal SVC level. Patient is status post CABG. Telemetry leads overlie the chest. IMPRESSION: Right PICC line tip overlies the distal SVC. No acute cardiopulmonary findings. Electronically Signed   By: Kennith Center M.D.   On: 08/18/2015 15:00   Dg Swallowing Func-speech Pathology  08/20/2015  Objective Swallowing Evaluation: Type of  Study: MBS-Modified Barium Swallow Study Patient Details Name: Vincente Asbridge MRN: [id number] Date of Birth: [birth year] Today's Date: 08/20/2015 Time: SLP Start Time (ACUTE ONLY): 0945-SLP Stop Time (ACUTE ONLY): 1020  SLP Time Calculation (min) (ACUTE ONLY): 35 min Past Medical History: Past Medical History Diagnosis Date . COPD (chronic obstructive pulmonary disease) (HCC)  . Renal insufficiency  Past Surgical History: Past Surgical History Procedure Laterality Date . Triple bypass  1995 . Flexible sigmoidoscopy N/A 2015/08/28   Procedure: FLEXIBLE SIGMOIDOSCOPY;  Surgeon: Bernette Redbird, MD;  Location: [facility] ENDOSCOPY;  Service: Endoscopy;  Laterality: N/A; HPI: Clair Alfieri is a 82 y.o. male presenting with hematochezia 1 day secondary to colitis. Found to have very large abdominal aortic aneurysm with possible plan for surgical intervention. PMH is significant for COPD, CAD, H/o CABG, hypertension, glaucoma, macular degeneration. No Data Recorded Assessment / Plan / Recommendation CHL IP CLINICAL IMPRESSIONS 08/20/2015 Therapy Diagnosis Mild cervical esophageal phase dysphagia;Suspected primary esophageal dysphagia;Mild pharyngeal phase dysphagia Clinical Impression Pt demonstrates mild to moderate dysphagia with a respiratory and esophageal component. Pts respiratory function is labored impacting swallowing by a slight delay in initiation as pt holds bolus to breathe with a shortened apneic period. Sensed frank penetraiton occured before the swallow x1 with thin liquids, though further trials at end of session were adeuqate. Pt appears to have better control of bolus size with a small straw sips as he cannot fully particiapte in holding cup to control bolus for cup sips. In addition to these deficits, pt is noted to have slighlty reduced cervical esophageal opening and esopahgeal sweep reveals diffuse stasis with solids and appearance of distal corkscrew-like contractions (no radiologist present to confirm). Recommend pt initaite a dys 2 (fine chop) diet for energy conservation with thin liquids with strict esophageal precautions. Pt will be at moderate risk of aspiration with any PO intake due to complexity of impairments.   Impact on safety and function Moderate aspiration risk   CHL IP TREATMENT RECOMMENDATION 08/20/2015 Treatment Recommendations Therapy as outlined in treatment plan below   Prognosis 08/20/2015 Prognosis for Safe Diet Advancement Fair Barriers to Reach Goals -- Barriers/Prognosis Comment -- CHL IP DIET RECOMMENDATION 08/20/2015 SLP Diet Recommendations Dysphagia 2 (Fine chop) solids;Thin liquid Liquid Administration via Straw Medication Administration Whole meds with puree Compensations Slow rate;Small sips/bites;Follow solids with liquid Postural Changes Seated upright at 90 degrees   CHL IP OTHER RECOMMENDATIONS 08/20/2015 Recommended Consults Consider esophageal assessment Oral Care Recommendations Oral care BID Other Recommendations --   CHL IP FOLLOW UP RECOMMENDATIONS 08/20/2015 Follow up Recommendations 24 hour supervision/assistance   CHL IP FREQUENCY AND DURATION 08/20/2015 Speech Therapy Frequency (ACUTE ONLY) min 2x/week Treatment Duration 2 weeks      CHL IP ORAL PHASE 08/20/2015 Oral Phase Impaired Oral - Pudding Teaspoon -- Oral - Pudding Cup -- Oral - Honey Teaspoon -- Oral - Honey Cup -- Oral - Nectar Teaspoon -- Oral - Nectar Cup Lingual/palatal residue;Reduced posterior propulsion Oral - Nectar Straw Lingual/palatal residue;Reduced posterior propulsion Oral - Thin Teaspoon -- Oral - Thin Cup Reduced posterior propulsion;Lingual/palatal residue Oral - Thin Straw Reduced posterior propulsion;Lingual/palatal residue Oral - Puree WFL Oral - Mech Soft -- Oral - Regular -- Oral - Multi-Consistency -- Oral - Pill WFL Oral Phase - Comment --  CHL IP PHARYNGEAL PHASE 08/20/2015 Pharyngeal Phase Impaired Pharyngeal- Pudding Teaspoon -- Pharyngeal -- Pharyngeal- Pudding Cup -- Pharyngeal -- Pharyngeal- Honey Teaspoon -- Pharyngeal -- Pharyngeal- Honey Cup -- Pharyngeal -- Pharyngeal- Nectar Teaspoon -- Pharyngeal -- Pharyngeal-  Nectar Cup Pharyngeal residue - pyriform;Delayed swallow initiation-vallecula;Reduced  anterior laryngeal mobility Pharyngeal -- Pharyngeal- Nectar Straw Delayed swallow initiation-vallecula;Pharyngeal residue - pyriform;Reduced anterior laryngeal mobility Pharyngeal -- Pharyngeal- Thin Teaspoon -- Pharyngeal -- Pharyngeal- Thin Cup Delayed swallow initiation-vallecula;Delayed swallow initiation-pyriform sinuses;Penetration/Aspiration before swallow;Reduced anterior laryngeal mobility;Pharyngeal residue - pyriform Pharyngeal Material enters airway, CONTACTS cords and not ejected out Pharyngeal- Thin Straw Delayed swallow initiation-vallecula;Pharyngeal residue - pyriform;Reduced anterior laryngeal mobility Pharyngeal -- Pharyngeal- Puree -- Pharyngeal -- Pharyngeal- Mechanical Soft -- Pharyngeal -- Pharyngeal- Regular -- Pharyngeal -- Pharyngeal- Multi-consistency -- Pharyngeal -- Pharyngeal- Pill -- Pharyngeal -- Pharyngeal Comment --  No flowsheet data found. No flowsheet data found. Harlon DittyBonnie DeBlois, [place]MA CCC-SLP [phone] Claudine MoutonDeBlois, Bonnie Caroline 08/20/2015, 11:31 AM              Ct Angio Abd/pel W/ And/or W/o  08/21/2015  CLINICAL DATA:  Nausea.  Constipation. EXAM: CTA ABDOMEN AND PELVIS wITHOUT AND WITH CONTRAST TECHNIQUE: Multidetector CT imaging of the abdomen and pelvis was performed using the standard protocol during bolus administration of intravenous contrast. Multiplanar reconstructed images and MIPs were obtained and reviewed to evaluate the vascular anatomy. CONTRAST:  80mL OMNIPAQUE IOHEXOL 350 MG/ML SOLN COMPARISON:  None. FINDINGS: There is a very large infrarenal abdominal aortic aneurysm. It is bilobed. Maximal AP and transverse diameters 9.7 and 10.1 cm respectively. The blood pool is poorly opacified within the aneurysm and iliac vasculature due to large volume. There is no extravasated contrast to suggest ruptured aneurysm. Mild narrowing of the celiac due to median arcuate ligament syndrome SMA is patent with mild atherosclerotic disease at its origin. Two right renal arteries and  a single left renal artery are diminutive and patent. IMA origin is occluded.  Branches reconstitute Right common iliac artery aneurysm is 4.1 cm in caliber. Left common iliac artery aneurysm is 1.7 cm in caliber. External iliac arteries are poorly opacified. Diffuse atherosclerotic changes of the external iliac arteries are patent without obvious focal severe narrowing. Severe emphysema towards the lung bases is present. There are multi focal indeterminate pulmonary opacities at the lung base. 1.6 cm more central left lower lobe parenchymal abnormality on image 5. There is a more patchy appearing opacity in the lingula measuring 17 mm on image 6. Tiny right lower lobe abnormality measures 3 mm on image 5. Patchy pulmonary opacities in the posterior and lateral left lung base on image 10 Diffuse hepatic steatosis. Gallbladder, spleen, pancreas, and adrenal glands are within normal limits. There is nonspecific perinephric stranding bilaterally worse on the left. There is significant wall thickening and wall edema involving the distal half of the descending colon, sigmoid colon, and rectum. There are significant inflammatory changes in the perirectal fat. No pneumatosis. No extraluminal bowel gas. No sign of significant diverticulosis in this portion of the colon. Right inguinal hernia contains adipose tissue. Review of the MIP images confirms the above findings. IMPRESSION: Very large infrarenal abdominal aortic aneurysm with a maximal diameter of 10.1 cm. Right common iliac artery aneurysm with a maximal caliber a 4.1 cm There is extensive wall thickening of the distal colon in a continuous fashion beginning in the descending colon and extending to the rectum with associated inflammatory changes in the perirectal fat. Differential diagnosis includes inflammatory bowel disease, infectious colitis, pseudomembranous colitis, and ischemia. Electronically Signed   By: Jolaine ClickArthur  Hoss M.D.   On: Apr 04, 2016 17:26    Anti-infectives: Anti-infectives    Start     Dose/Rate Route Frequency Ordered Stop   08/18/15 1430  levofloxacin (LEVAQUIN) IVPB 750 mg     750 mg 100 mL/hr over 90 Minutes Intravenous Every 24 hours 08/18/15 1059     2015/09/08 1800  levofloxacin (LEVAQUIN) IVPB 750 mg  Status:  Discontinued     750 mg 100 mL/hr over 90 Minutes Intravenous Every 48 hours 08/21/2015 0125 09/08/15 1351   09-08-2015 1430  levofloxacin (LEVAQUIN) IVPB 500 mg  Status:  Discontinued     500 mg 100 mL/hr over 60 Minutes Intravenous Every 24 hours 09/08/15 1351 08/18/15 1059   09/04/2015 0400  metroNIDAZOLE (FLAGYL) IVPB 500 mg     500 mg 100 mL/hr over 60 Minutes Intravenous 3 times per day 08/19/2015 0125     08/11/2015 1830  metroNIDAZOLE (FLAGYL) IVPB 500 mg  Status:  Discontinued     500 mg 100 mL/hr over 60 Minutes Intravenous Every 8 hours 08/28/2015 1821 08/14/2015 2133   09/02/2015 1830  levofloxacin (LEVAQUIN) IVPB 750 mg  Status:  Discontinued     750 mg 100 mL/hr over 90 Minutes Intravenous Every 48 hours 08/27/2015 1823 08/11/2015 2133   08/19/2015 1745  ciprofloxacin (CIPRO) IVPB 400 mg  Status:  Discontinued     400 mg 200 mL/hr over 60 Minutes Intravenous  Once 09/07/2015 1732 09/08/2015 1823   08/29/2015 1745  metroNIDAZOLE (FLAGYL) IVPB 500 mg  Status:  Discontinued     500 mg 100 mL/hr over 60 Minutes Intravenous  Once 08/24/2015 1732 08/31/2015 1821   08/24/2015 1745  piperacillin-tazobactam (ZOSYN) IVPB 3.375 g  Status:  Discontinued     3.375 g 100 mL/hr over 30 Minutes Intravenous  Once 09/03/2015 1737 09/05/2015 1737   09/08/2015 1745  levofloxacin (LEVAQUIN) IVPB 750 mg  Status:  Discontinued     750 mg 100 mL/hr over 90 Minutes Intravenous  Once 08/19/2015 1738 08/29/2015 1811   08/21/2015 1745  metroNIDAZOLE (FLAGYL) IVPB 500 mg  Status:  Discontinued     500 mg 100 mL/hr over 60 Minutes Intravenous  Once 08/19/2015 1738 08/31/2015 1812      Assessment/Plan: s/p Procedure(s): FLEXIBLE SIGMOIDOSCOPY (N/A) If can  change in respiratory status this morning. No evidence that he has had any change in his large aneurysm. Specifically no change in his hemoglobin and hematocrit and no abdominal pain or tenderness. Agree with plan to transfer to intensive care unit. Suspect high risk for needing intubation with his current respiratory status.   LOS: 6 days   Gretta Began 08/21/2015, 7:54 AM

## 2015-08-21 NOTE — Progress Notes (Addendum)
[facility] Progress Note  Subjective: Patient developed acute respiratory failure/COPD exacerbation requiring ventilation and pressor agents, with moderate leukocytosis and lactic acidosis. Sigmoidoscopy showed diffuse colitis to 40 cm on 39 felt likely ischemic.  Objective: Vital signs in last 24 hours: Temp:  [98.2 F (36.8 C)-99.3 F (37.4 C)] 98.4 F (36.9 C) (03/13 0905) Pulse Rate:  [110-126] 121 (03/13 0832) Resp:  [16-54] 16 (03/13 0832) BP: (89-147)/(47-109) 89/47 mmHg (03/13 0736) SpO2:  [87 %-100 %] 100 % (03/13 1032) FiO2 (%):  [40 %-100 %] 40 % (03/13 1032) Weight change:    PE: Patient undergoing a bedside procedure, Not examined  Lab Results: Results for orders placed or performed during the hospital encounter of 2015/09/08 (from the past 24 hour(s))  Glucose, capillary     Status: Abnormal   Collection Time: 08/20/15 11:52 AM  Result Value Ref Range   Glucose-Capillary 136 (H) 65 - 99 mg/dL  Glucose, capillary     Status: Abnormal   Collection Time: 08/20/15  4:31 PM  Result Value Ref Range   Glucose-Capillary 134 (H) 65 - 99 mg/dL  Glucose, capillary     Status: Abnormal   Collection Time: 08/20/15  7:52 PM  Result Value Ref Range   Glucose-Capillary 132 (H) 65 - 99 mg/dL  Glucose, capillary     Status: Abnormal   Collection Time: 08/20/15 11:14 PM  Result Value Ref Range   Glucose-Capillary 152 (H) 65 - 99 mg/dL  Glucose, capillary     Status: Abnormal   Collection Time: 08/21/15  3:54 AM  Result Value Ref Range   Glucose-Capillary 159 (H) 65 - 99 mg/dL  Comprehensive metabolic panel     Status: Abnormal   Collection Time: 08/21/15  5:10 AM  Result Value Ref Range   Sodium 133 (L) 135 - 145 mmol/L   Potassium 4.1 3.5 - 5.1 mmol/L   Chloride 99 (L) 101 - 111 mmol/L   CO2 25 22 - 32 mmol/L   Glucose, Bld 191 (H) 65 - 99 mg/dL   BUN 14 6 - 20 mg/dL   Creatinine, Ser 5.36 0.61 - 1.24 mg/dL   Calcium 8.2 (L) 8.9 - 10.3 mg/dL   Total Protein  5.9 (L) 6.5 - 8.1 g/dL   Albumin 2.0 (L) 3.5 - 5.0 g/dL   AST 21 15 - 41 U/L   ALT 13 (L) 17 - 63 U/L   Alkaline Phosphatase 46 38 - 126 U/L   Total Bilirubin 0.5 0.3 - 1.2 mg/dL   GFR calc non Af Amer >60 >60 mL/min   GFR calc Af Amer >60 >60 mL/min   Anion gap 9 5 - 15  Magnesium     Status: None   Collection Time: 08/21/15  5:10 AM  Result Value Ref Range   Magnesium 1.9 1.7 - 2.4 mg/dL  Phosphorus     Status: Abnormal   Collection Time: 08/21/15  5:10 AM  Result Value Ref Range   Phosphorus 2.3 (L) 2.5 - 4.6 mg/dL  Prealbumin     Status: Abnormal   Collection Time: 08/21/15  5:10 AM  Result Value Ref Range   Prealbumin 5.5 (L) 18 - 38 mg/dL  Triglycerides     Status: None   Collection Time: 08/21/15  5:10 AM  Result Value Ref Range   Triglycerides 59 <150 mg/dL  Blood gas, arterial     Status: Abnormal   Collection Time: 08/21/15  5:45 AM  Result Value Ref Range   FIO2 100.00  Delivery systems NON-REBREATHER OXYGEN MASK    pH, Arterial 7.272 (L) 7.350 - 7.450   pCO2 arterial 60.0 (HH) 35.0 - 45.0 mmHg   pO2, Arterial 178 (H) 80.0 - 100.0 mmHg   Bicarbonate 26.8 (H) 20.0 - 24.0 mEq/L   TCO2 28.6 0 - 100 mmol/L   Acid-Base Excess 0.6 0.0 - 2.0 mmol/L   O2 Saturation 99.1 %   Patient temperature 98.6    Collection site LEFT RADIAL    Drawn by [phone]    Sample type ARTERIAL DRAW    Allens test (pass/fail) PASS PASS  Lactic acid, plasma     Status: None   Collection Time: 08/21/15  5:55 AM  Result Value Ref Range   Lactic Acid, Venous 1.5 0.5 - 2.0 mmol/L  I-STAT 3, arterial blood gas (G3+)     Status: Abnormal   Collection Time: 08/21/15  9:43 AM  Result Value Ref Range   pH, Arterial 7.252 (L) 7.350 - 7.450   pCO2 arterial 63.1 (HH) 35.0 - 45.0 mmHg   pO2, Arterial 353.0 (H) 80.0 - 100.0 mmHg   Bicarbonate 27.8 (H) 20.0 - 24.0 mEq/L   TCO2 30 0 - 100 mmol/L   O2 Saturation 100.0 %   Patient temperature 98.6 F    Collection site RADIAL, ALLEN'S TEST ACCEPTABLE     Sample type ARTERIAL    Comment NOTIFIED PHYSICIAN   CBC with Differential/Platelet     Status: Abnormal (Preliminary result)   Collection Time: 08/21/15 10:26 AM  Result Value Ref Range   WBC 19.0 (H) 4.0 - 10.5 K/uL   RBC 3.28 (L) 4.22 - 5.81 MIL/uL   Hemoglobin 8.7 (L) 13.0 - 17.0 g/dL   HCT 84.6 (L) 96.2 - 95.2 %   MCV 88.7 78.0 - 100.0 fL   MCH 26.5 26.0 - 34.0 pg   MCHC 29.9 (L) 30.0 - 36.0 g/dL   RDW 84.1 32.4 - 40.1 %   Platelets 151 150 - 400 K/uL   Neutrophils Relative % PENDING %   Neutro Abs PENDING 1.7 - 7.7 K/uL   Band Neutrophils PENDING %   Lymphocytes Relative PENDING %   Lymphs Abs PENDING 0.7 - 4.0 K/uL   Monocytes Relative PENDING %   Monocytes Absolute PENDING 0.1 - 1.0 K/uL   Eosinophils Relative PENDING %   Eosinophils Absolute PENDING 0.0 - 0.7 K/uL   Basophils Relative PENDING %   Basophils Absolute PENDING 0.0 - 0.1 K/uL   WBC Morphology PENDING    RBC Morphology PENDING    Smear Review PENDING    nRBC PENDING 0 /100 WBC   Metamyelocytes Relative PENDING %   Myelocytes PENDING %   Promyelocytes Absolute PENDING %   Blasts PENDING %  Lactic acid, plasma     Status: None   Collection Time: 08/21/15 10:27 AM  Result Value Ref Range   Lactic Acid, Venous 1.5 0.5 - 2.0 mmol/L    Studies/Results: Dg Chest 1 View  08/20/2015  CLINICAL DATA:  Shortness of breath, tachypnea, COPD EXAM: CHEST 1 VIEW COMPARISON:  08/18/2015.  Films dating back to 08/13/2015 FINDINGS: There is hyperinflation of the lungs compatible with COPD. Heart is normal size. Prior CABG. Areas of scarring in the lungs bilaterally left perihilar and lower lobe airspace opacity noted, increased since 09/06/2015. This could reflect infiltrate/pneumonia. Trace bilateral effusions. Mild right basilar atelectasis or infiltrate. IMPRESSION: COPD. Increasing left perihilar and bibasilar airspace opacities which could reflect atelectasis or infiltrates. Trace effusions bilaterally.  Electronically Signed   By: Charlett NoseKevin  Dover M.D.   On: 08/20/2015 10:29   Dg Chest Port 1 View  08/21/2015  CLINICAL DATA:  Encounter for intubation EXAM: PORTABLE CHEST 1 VIEW COMPARISON:  Portable exam 0916 hours compared to 0616 hours. FINDINGS: Endotracheal tube projects at inferior aspect of aortic arch, questionably 2.0 cm above carina, carina poorly localized. Nasogastric tube extends into stomach. RIGHT arm PICC line tip projects over SVC above cavoatrial junction. Post median sternotomy. BILATERAL perihilar infiltrates extending into both lung bases. No gross pleural effusion or pneumothorax. IMPRESSION: Persistent pulmonary infiltrates, little changed. Line and tube positions as above. Electronically Signed   By: Ulyses SouthwardMark  Boles M.D.   On: 08/21/2015 09:46   Dg Chest Port 1 View  08/21/2015  CLINICAL DATA:  Hypoxia EXAM: PORTABLE CHEST 1 VIEW COMPARISON:  08/20/2015 FINDINGS: Cardiac shadow is stable. Postoperative changes are again seen. Right-sided PICC line is noted in satisfactory position. Patchy opacities are noted bilaterally slightly increased in the bases when compared with the prior exam. IMPRESSION: Increased patchy infiltrate in the bases bilaterally. Electronically Signed   By: Alcide CleverMark  Lukens M.D.   On: 08/21/2015 08:06   Dg Swallowing Func-speech Pathology  08/20/2015  Objective Swallowing Evaluation: Type of Study: MBS-Modified Barium Swallow Study Patient Details Name: Jama FlavorsWillard Mastro MRN: [id number] Date of Birth: 06/27/1929 Today's Date: 08/20/2015 Time: SLP Start Time (ACUTE ONLY): 0945-SLP Stop Time (ACUTE ONLY): 1020 SLP Time Calculation (min) (ACUTE ONLY): 35 min Past Medical History: Past Medical History Diagnosis Date . COPD (chronic obstructive pulmonary disease) (HCC)  . Renal insufficiency  Past Surgical History: Past Surgical History Procedure Laterality Date . Triple bypass  1995 . Flexible sigmoidoscopy N/A 08/20/2015   Procedure: FLEXIBLE SIGMOIDOSCOPY;  Surgeon: Bernette Redbirdobert Buccini, MD;   [facility];  Service: Endoscopy;  Laterality: N/A; HPI: Jama FlavorsWillard Badon is a 82 y.o. male presenting with hematochezia 1 day secondary to colitis. Found to have very large abdominal aortic aneurysm with possible plan for surgical intervention. PMH is significant for COPD, CAD, H/o CABG, hypertension, glaucoma, macular degeneration. No Data Recorded Assessment / Plan / Recommendation CHL IP CLINICAL IMPRESSIONS 08/20/2015 Therapy Diagnosis Mild cervical esophageal phase dysphagia;Suspected primary esophageal dysphagia;Mild pharyngeal phase dysphagia Clinical Impression Pt demonstrates mild to moderate dysphagia with a respiratory and esophageal component. Pts respiratory function is labored impacting swallowing by a slight delay in initiation as pt holds bolus to breathe with a shortened apneic period. Sensed frank penetraiton occured before the swallow x1 with thin liquids, though further trials at end of session were adeuqate. Pt appears to have better control of bolus size with a small straw sips as he cannot fully particiapte in holding cup to control bolus for cup sips. In addition to these deficits, pt is noted to have slighlty reduced cervical esophageal opening and esopahgeal sweep reveals diffuse stasis with solids and appearance of distal corkscrew-like contractions (no radiologist present to confirm). Recommend pt initaite a dys 2 (fine chop) diet for energy conservation with thin liquids with strict esophageal precautions. Pt will be at moderate risk of aspiration with any PO intake due to complexity of impairments.  Impact on safety and function Moderate aspiration risk   CHL IP TREATMENT RECOMMENDATION 08/20/2015 Treatment Recommendations Therapy as outlined in treatment plan below   Prognosis 08/20/2015 Prognosis for Safe Diet Advancement Fair Barriers to Reach Goals -- Barriers/Prognosis Comment -- CHL IP DIET RECOMMENDATION 08/20/2015 SLP Diet Recommendations Dysphagia 2 (Fine chop)  solids;Thin liquid Liquid Administration via Straw Medication  Administration Whole meds with puree Compensations Slow rate;Small sips/bites;Follow solids with liquid Postural Changes Seated upright at 90 degrees   CHL IP OTHER RECOMMENDATIONS 08/20/2015 Recommended Consults Consider esophageal assessment Oral Care Recommendations Oral care BID Other Recommendations --   CHL IP FOLLOW UP RECOMMENDATIONS 08/20/2015 Follow up Recommendations 24 hour supervision/assistance   CHL IP FREQUENCY AND DURATION 08/20/2015 Speech Therapy Frequency (ACUTE ONLY) min 2x/week Treatment Duration 2 weeks      CHL IP ORAL PHASE 08/20/2015 Oral Phase Impaired Oral - Pudding Teaspoon -- Oral - Pudding Cup -- Oral - Honey Teaspoon -- Oral - Honey Cup -- Oral - Nectar Teaspoon -- Oral - Nectar Cup Lingual/palatal residue;Reduced posterior propulsion Oral - Nectar Straw Lingual/palatal residue;Reduced posterior propulsion Oral - Thin Teaspoon -- Oral - Thin Cup Reduced posterior propulsion;Lingual/palatal residue Oral - Thin Straw Reduced posterior propulsion;Lingual/palatal residue Oral - Puree WFL Oral - Mech Soft -- Oral - Regular -- Oral - Multi-Consistency -- Oral - Pill WFL Oral Phase - Comment --  CHL IP PHARYNGEAL PHASE 08/20/2015 Pharyngeal Phase Impaired Pharyngeal- Pudding Teaspoon -- Pharyngeal -- Pharyngeal- Pudding Cup -- Pharyngeal -- Pharyngeal- Honey Teaspoon -- Pharyngeal -- Pharyngeal- Honey Cup -- Pharyngeal -- Pharyngeal- Nectar Teaspoon -- Pharyngeal -- Pharyngeal- Nectar Cup Pharyngeal residue - pyriform;Delayed swallow initiation-vallecula;Reduced anterior laryngeal mobility Pharyngeal -- Pharyngeal- Nectar Straw Delayed swallow initiation-vallecula;Pharyngeal residue - pyriform;Reduced anterior laryngeal mobility Pharyngeal -- Pharyngeal- Thin Teaspoon -- Pharyngeal -- Pharyngeal- Thin Cup Delayed swallow initiation-vallecula;Delayed swallow initiation-pyriform sinuses;Penetration/Aspiration before swallow;Reduced  anterior laryngeal mobility;Pharyngeal residue - pyriform Pharyngeal Material enters airway, CONTACTS cords and not ejected out Pharyngeal- Thin Straw Delayed swallow initiation-vallecula;Pharyngeal residue - pyriform;Reduced anterior laryngeal mobility Pharyngeal -- Pharyngeal- Puree -- Pharyngeal -- Pharyngeal- Mechanical Soft -- Pharyngeal -- Pharyngeal- Regular -- Pharyngeal -- Pharyngeal- Multi-consistency -- Pharyngeal -- Pharyngeal- Pill -- Pharyngeal -- Pharyngeal Comment --  No flowsheet data found. No flowsheet data found. Harlon Ditty, [place] CCC-SLP [phone] Claudine Mouton 08/20/2015, 11:31 AM                 Assessment: Large AAA Acute colitis, suspect ischemic Acute respiratory failure possibly associated with colitis  Plan: IV antibiotics Supportive care Agree with abdominal pelvic CT scan, Possible repeat sigmoidoscopy, possible general surgical consult if nonviable bowel suspected, and patient considered an operative candidate. Vascular surgery following    Jocelyn Lowery C 08/21/2015, 11:38 AM  Pager [phone] If no answer or after 5 PM call [phone]

## 2015-08-21 NOTE — Progress Notes (Signed)
Arrived to patients room due to ABG order on worklist. Upon arrival to room, inquired with nurse why ABG was ordered. She states pt has been having trouble breathing and was placed on NRB nearly 1 hour ago. Respiratory was never notified. Pt with increased respiratory rate, wheezes, and coarse crackles throughout. Informed RN that patient is most likely in fluid overload, she agrees.  ABG results called to RN, no new orders at this time.

## 2015-08-21 NOTE — Care Management Note (Signed)
Case Management Note  Patient Details  Name: Jama FlavorsWillard Cygan MRN: [id number] Date of Birth: 08/11/1929  Subjective/Objective:   [facility], with ischemic colits and AAA 9.7 cm , developed Resp distress ,transferred to ICU , intubated. [facility] when medically stable.                 Action/Plan:   Expected Discharge Date:                  Expected Discharge Plan:  [facility]  In-House Referral:  Clinical Social Work  Discharge planning Services  CM Consult  Post Acute Care Choice:    Choice offered to:     DME Arranged:    DME Agency:     HH Arranged:    HH Agency:     Status of Service:  In process, will continue to follow  Medicare Important Message Given:    Date Medicare IM Given:    Medicare IM give by:    Date Additional Medicare IM Given:    Additional Medicare Important Message give by:     If discussed at Long Length of Stay Meetings, dates discussed:    Additional Comments:  Leone Havenaylor, Jennice Renegar Clinton, RN 08/21/2015, 10:24 PM

## 2015-08-21 NOTE — Progress Notes (Signed)
Nutrition Follow-up  DOCUMENTATION CODES:   Not applicable  INTERVENTION:    May be unable to meet new nutrition goals with pre-mixed TPN solution (Clinimix).  Since GI status was improved enough to start PO diet yesterday, recommend start TF via OGT with Vital AF 1.2 at 20 ml/h, increase by 10 ml every 4 hours to goal rate of 50 ml/h with Prostat 30 ml BID to provide 1640 kcals, 120 gm protein, 973 ml free water daily.  NUTRITION DIAGNOSIS:   Inadequate oral intake related to inability to eat as evidenced by NPO status.  Ongoing  GOAL:   Patient will meet greater than or equal to 90% of their needs  Met  MONITOR:   Vent status, Labs, Weight trends, I & O's  REASON FOR ASSESSMENT:   Ventilator  ASSESSMENT:   82 Y/O M with PMX of COPD. CAD S/P CABG, HTN [facility] 3 days hx of constipation associated with nausea, no vomiting and hx of blood in his stool.  S/P FEES on 3/12, diet was advanced to dysphagia 2 with thin liquids on 3/12. Patient also receiving TPN for ischemic colitis. Noted hematochezia resolved.   Patient is receiving TPN with Clinimix E 5/15 increasing to 80 ml/hr and lipids at 10 ml/hr. Provides 2160 ml, 1843 kcal, and 96 grams protein per day. Meets 100% minimum estimated energy needs and 100% minimum estimated protein needs.  Patient developed respiratory distress and required intubation this morning. MV: 7.9 L/min Temp (24hrs), Avg:98.8 F (37.1 C), Min:98.4 F (36.9 C), Max:99.3 F (37.4 C)   Diet Order:  TPN (CLINIMIX-E) Adult Diet NPO time specified TPN (CLINIMIX-E) Adult  Skin:  Reviewed, no issues  Last BM:  3/11  Height:   Ht Readings from Last 1 Encounters:  08/28/2015 _0  (1.702 m)    Weight:   Wt Readings from Last 1 Encounters:  09/08/2015 156 lb 1.4 oz (70.8 kg)    Ideal Body Weight:  67.3 kg  BMI:  Body mass index is 24.44 kg/(m^2).  Estimated Nutritional Needs:   Kcal:  1600  Protein:  105-120  gm  Fluid:  1.8-2 L  EDUCATION NEEDS:   No education needs identified at this time  Molli Barrows, [place], [place], [place] Pager [phone] After Hours Pager [phone]

## 2015-08-21 NOTE — Progress Notes (Signed)
eLink Physician-Brief Progress Note Patient Name: Micheal FlavorsWillard Orozco DOB: 02/27/1930 MRN: [id number]   Date of Service  08/21/2015  HPI/Events of Note  Ventilated patient. No stress ulcer prophylaxis.   eICU Interventions  Will order Protonix IV.     Intervention Category Intermediate Interventions: Best-practice therapies (e.g. DVT, beta blocker, etc.)  Micheal Orozco [place] 08/21/2015, 7:19 PM

## 2015-08-22 ENCOUNTER — Other Ambulatory Visit: Payer: Self-pay

## 2015-08-22 ENCOUNTER — Inpatient Hospital Stay (HOSPITAL_COMMUNITY): Payer: Medicaid Other

## 2015-08-22 DIAGNOSIS — J449 Chronic obstructive pulmonary disease, unspecified: Secondary | ICD-10-CM | POA: Insufficient documentation

## 2015-08-22 DIAGNOSIS — J410 Simple chronic bronchitis: Secondary | ICD-10-CM

## 2015-08-22 DIAGNOSIS — R1010 Upper abdominal pain, unspecified: Secondary | ICD-10-CM

## 2015-08-22 LAB — BLOOD GAS, ARTERIAL
Acid-base deficit: 3.2 mmol/L — ABNORMAL HIGH (ref 0.0–2.0)
BICARBONATE: 21.2 meq/L (ref 20.0–24.0)
Drawn by: 437071
FIO2: 0.4
LHR: 30 {breaths}/min
O2 Saturation: 98.8 %
PEEP/CPAP: 5 cmH2O
Patient temperature: 98.5
Pressure control: 30 cmH2O
TCO2: 22.4 mmol/L (ref 0–100)
pCO2 arterial: 37.2 mmHg (ref 35.0–45.0)
pH, Arterial: 7.374 (ref 7.350–7.450)
pO2, Arterial: 136 mmHg — ABNORMAL HIGH (ref 80.0–100.0)

## 2015-08-22 LAB — URINE MICROSCOPIC-ADD ON: Squamous Epithelial / LPF: NONE SEEN

## 2015-08-22 LAB — GLUCOSE, CAPILLARY
GLUCOSE-CAPILLARY: 142 mg/dL — AB (ref 65–99)
GLUCOSE-CAPILLARY: 153 mg/dL — AB (ref 65–99)
GLUCOSE-CAPILLARY: 164 mg/dL — AB (ref 65–99)
GLUCOSE-CAPILLARY: 198 mg/dL — AB (ref 65–99)
GLUCOSE-CAPILLARY: 212 mg/dL — AB (ref 65–99)
Glucose-Capillary: 193 mg/dL — ABNORMAL HIGH (ref 65–99)
Glucose-Capillary: 152 mg/dL — ABNORMAL HIGH (ref 65–99)
Glucose-Capillary: 112 mg/dL — ABNORMAL HIGH (ref 65–99)

## 2015-08-22 LAB — BASIC METABOLIC PANEL
Anion gap: 8 (ref 5–15)
BUN: 32 mg/dL — ABNORMAL HIGH (ref 6–20)
CHLORIDE: 102 mmol/L (ref 101–111)
CO2: 20 mmol/L — AB (ref 22–32)
CREATININE: 1.04 mg/dL (ref 0.61–1.24)
Calcium: 8 mg/dL — ABNORMAL LOW (ref 8.9–10.3)
GFR calc non Af Amer: >60 mL/min (ref >60)
Glucose, Bld: 213 mg/dL — ABNORMAL HIGH (ref 65–99)
POTASSIUM: 3.9 mmol/L (ref 3.5–5.1)
Sodium: 130 mmol/L — ABNORMAL LOW (ref 135–145)

## 2015-08-22 LAB — POCT I-STAT 3, ART BLOOD GAS (G3+)
Acid-base deficit: 2 mmol/L (ref 0.0–2.0)
Acid-base deficit: 5 mmol/L — ABNORMAL HIGH (ref 0.0–2.0)
BICARBONATE: 23.7 meq/L (ref 20.0–24.0)
BICARBONATE: 20.5 meq/L (ref 20.0–24.0)
O2 SAT: 98 %
O2 Saturation: 97 %
PCO2 ART: 46 mmHg — AB (ref 35.0–45.0)
PH ART: 7.321 — AB (ref 7.350–7.450)
PH ART: 7.323 — AB (ref 7.350–7.450)
PO2 ART: 123 mmHg — AB (ref 80.0–100.0)
PO2 ART: 99 mmHg (ref 80.0–100.0)
Patient temperature: 98.7
Patient temperature: 98.7
TCO2: 25 mmol/L (ref 0–100)
TCO2: 22 mmol/L (ref 0–100)
pCO2 arterial: 39.6 mmHg (ref 35.0–45.0)

## 2015-08-22 LAB — URINALYSIS, ROUTINE W REFLEX MICROSCOPIC
Bilirubin Urine: NEGATIVE
GLUCOSE, UA: NEGATIVE mg/dL
KETONES UR: NEGATIVE mg/dL
Nitrite: NEGATIVE
PH: 6 (ref 5.0–8.0)
Protein, ur: 30 mg/dL — AB
Specific Gravity, Urine: 1.035 — ABNORMAL HIGH (ref 1.005–1.030)

## 2015-08-22 LAB — CBC
HCT: 25.7 % — ABNORMAL LOW (ref 39.0–52.0)
HEMATOCRIT: 26.3 % — AB (ref 39.0–52.0)
HEMOGLOBIN: 8.1 g/dL — AB (ref 13.0–17.0)
HEMOGLOBIN: 8 g/dL — AB (ref 13.0–17.0)
MCH: 26.6 pg (ref 26.0–34.0)
MCH: 27 pg (ref 26.0–34.0)
MCHC: 30.8 g/dL (ref 30.0–36.0)
MCHC: 31.1 g/dL (ref 30.0–36.0)
MCV: 86.5 fL (ref 78.0–100.0)
MCV: 86.8 fL (ref 78.0–100.0)
PLATELETS: 137 10*3/uL — AB (ref 150–400)
Platelets: 149 10*3/uL — ABNORMAL LOW (ref 150–400)
RBC: 3.04 MIL/uL — ABNORMAL LOW (ref 4.22–5.81)
RBC: 2.96 MIL/uL — AB (ref 4.22–5.81)
RDW: 14.1 % (ref 11.5–15.5)
RDW: 14.1 % (ref 11.5–15.5)
WBC: 19 10*3/uL — ABNORMAL HIGH (ref 4.0–10.5)
WBC: 18.5 10*3/uL — ABNORMAL HIGH (ref 4.0–10.5)

## 2015-08-22 LAB — MAGNESIUM: MAGNESIUM: 1.9 mg/dL (ref 1.7–2.4)

## 2015-08-22 LAB — OSMOLALITY, URINE: Osmolality, Ur: 454 mOsm/kg (ref 300–900)

## 2015-08-22 LAB — LACTIC ACID, PLASMA: LACTIC ACID, VENOUS: 1.4 mmol/L (ref 0.5–2.0)

## 2015-08-22 LAB — SODIUM, URINE, RANDOM: Sodium, Ur: <10 mmol/L

## 2015-08-22 LAB — PHOSPHORUS: PHOSPHORUS: 2.9 mg/dL (ref 2.5–4.6)

## 2015-08-22 MED ORDER — VITAL HIGH PROTEIN PO LIQD
1000.0000 mL | ORAL | Status: DC
  Administered 2015-08-22: 1000 mL

## 2015-08-22 MED ORDER — VITAL AF 1.2 CAL PO LIQD
1000.0000 mL | ORAL | Status: DC
  Administered 2015-08-22 – 2015-08-24 (×3): 1000 mL

## 2015-08-22 MED ORDER — SODIUM CHLORIDE 0.9 % IV SOLN
25.0000 ug/h | INTRAVENOUS | Status: DC
  Administered 2015-08-22: 50 ug/h via INTRAVENOUS
  Administered 2015-08-22: 150 ug/h via INTRAVENOUS
  Administered 2015-08-23: 100 ug/h via INTRAVENOUS
  Filled 2015-08-22 (×2): qty 50

## 2015-08-22 MED ORDER — PRO-STAT SUGAR FREE PO LIQD
30.0000 mL | Freq: Two times a day (BID) | ORAL | Status: DC
  Filled 2015-08-22: qty 30

## 2015-08-22 MED ORDER — VITAL AF 1.2 CAL PO LIQD
1000.0000 mL | ORAL | Status: DC
  Filled 2015-08-22: qty 1000

## 2015-08-22 MED ORDER — TRACE MINERALS CR-CU-MN-SE-ZN 10-1000-500-60 MCG/ML IV SOLN
INTRAVENOUS | Status: DC
  Filled 2015-08-22: qty 1920

## 2015-08-22 MED ORDER — METHYLPREDNISOLONE SODIUM SUCC 125 MG IJ SOLR
125.0000 mg | Freq: Once | INTRAMUSCULAR | Status: AC
  Administered 2015-08-22: 125 mg via INTRAVENOUS
  Filled 2015-08-22: qty 2

## 2015-08-22 MED ORDER — INSULIN ASPART 100 UNIT/ML ~~LOC~~ SOLN
0.0000 [IU] | SUBCUTANEOUS | Status: DC
  Administered 2015-08-22 (×2): 4 [IU] via SUBCUTANEOUS
  Administered 2015-08-23 – 2015-08-24 (×6): 3 [IU] via SUBCUTANEOUS

## 2015-08-22 MED ORDER — FENTANYL BOLUS VIA INFUSION
25.0000 ug | INTRAVENOUS | Status: DC | PRN
  Filled 2015-08-22: qty 25

## 2015-08-22 MED ORDER — SODIUM CHLORIDE 0.9 % IV BOLUS (SEPSIS)
500.0000 mL | Freq: Once | INTRAVENOUS | Status: AC
  Administered 2015-08-22: 500 mL via INTRAVENOUS

## 2015-08-22 MED ORDER — FENTANYL CITRATE (PF) 100 MCG/2ML IJ SOLN: 50.0000 ug | Freq: Once | INTRAMUSCULAR | Status: AC

## 2015-08-22 MED ORDER — METHYLPREDNISOLONE SODIUM SUCC 125 MG IJ SOLR
80.0000 mg | Freq: Four times a day (QID) | INTRAMUSCULAR | Status: DC
  Administered 2015-08-22 – 2015-08-23 (×6): 80 mg via INTRAVENOUS
  Administered 2015-08-24: 60 mg via INTRAVENOUS
  Administered 2015-08-24: 80 mg via INTRAVENOUS
  Filled 2015-08-22: qty 1
  Filled 2015-08-22 (×4): qty 1.28
  Filled 2015-08-22: qty 1
  Filled 2015-08-22 (×5): qty 1.28

## 2015-08-22 MED ORDER — FAT EMULSION 20 % IV EMUL
240.0000 mL | INTRAVENOUS | Status: DC
Start: 2015-08-22 — End: 2015-08-22
  Filled 2015-08-22: qty 250

## 2015-08-22 NOTE — Progress Notes (Signed)
Nutrition Follow-up / Consult  DOCUMENTATION CODES:   Not applicable  INTERVENTION:    Initiate TF via OGT with Vital AF 1.2 at 20 ml/h, increase by 10 ml every 4 hours to goal rate of 65 ml/h (1560 ml per day) to provide 1872 kcals, 117 gm protein, 1265 ml free water daily.  NUTRITION DIAGNOSIS:   Inadequate oral intake related to inability to eat as evidenced by NPO status.  Ongoing  GOAL:   Patient will meet greater than or equal to 90% of their needs  Met with TPN  MONITOR:   Vent status, Labs, Weight trends, I & O's   ASSESSMENT:   82 Y/O M with PMX of COPD. CAD S/P CABG, HTN was brought in from jail for 3 days hx of constipation associated with nausea, no vomiting and hx of blood in his stool.  Received MD Consult for TF initiation and management. Patient is receiving TPN, rate being decreased to 40 ml/h x 4 hours, then turned off.  Patient is currently intubated on ventilator support MV: 13.7 L/min Temp (24hrs), Avg:98.6 F (37 C), Min:98.3 F (36.8 C), Max:99.4 F (37.4 C)   Diet Order:  Diet NPO time specified TPN (CLINIMIX-E) Adult  Skin:  Reviewed, no issues  Last BM:  3/11  Height:   Ht Readings from Last 1 Encounters:  08/18/2015 5' 7"  (1.702 m)    Weight:   Wt Readings from Last 1 Encounters:  08/22/15 167 lb 5.3 oz (75.9 kg)    Ideal Body Weight:  67.3 kg  BMI:  Body mass index is 26.2 kg/(m^2).  Estimated Nutritional Needs:   Kcal:  9244  Protein:  105-120 gm  Fluid:  1.8-2 L  EDUCATION NEEDS:   No education needs identified at this time   Molli Barrows, [place], [place], [place] Pager [phone] After Hours Pager [phone]

## 2015-08-22 NOTE — Consult Note (Signed)
PULMONARY / CRITICAL CARE MEDICINE   Name: Micheal Orozco MRN: [id number] DOB: Nov 08, 1929    ADMISSION DATE:  08/27/2015 CONSULTATION DATE:  08/21/2015  REFERRING MD:  Danise Edge  CHIEF COMPLAINT:  VDRF  HISTORY OF PRESENT ILLNESS:   82 year old male with extensive PMH who presents to the hospital with multiple complaints to include AAA that VVS feels need to be operated on when more stable, GI bleeding that was evaluated by GI and bx consistent with ischemic colitis and CP that was evaluated by CVTS and recommended repair of the AAA via VVS.  On 3/13, patient developed acute hypercarbic respiratory failure likely acute exacerbation of COPD with all the associated inflammatory issues due to ischemic colitis.  PCCM was consulted and patient was immediately intubated.  There is no contact family numbers for patient.  So full code for now.  SUBJECTIVE:  Increase distress on vent, tachy  VITAL SIGNS: BP 96/78 mmHg  Pulse 131  Temp(Src) 98.3 F (36.8 C) (Oral)  Resp 31  Ht 5\' 7"  (1.702 m)  Wt 75.9 kg (167 lb 5.3 oz)  BMI 26.20 kg/m2  SpO2 100%  HEMODYNAMICS: CVP:  [8 mmHg-17 mmHg] 17 mmHg  VENTILATOR SETTINGS: Vent Mode:  [-] PCV FiO2 (%):  [40 %] 40 % Set Rate:  [26 bmp-30 bmp] 30 bmp PEEP:  [5 cmH20] 5 cmH20 Plateau Pressure:  [30 cmH20-37 cmH20] 34 cmH20  INTAKE / OUTPUT: I/O last 3 completed shifts: In: 7659.7 [P.O.:60; I.V.:3064.7; IV Piggyback:1100] Out: 2100 [Urine:1600; Emesis/NG output:500]  PHYSICAL EXAMINATION: General:  Chronically ill appearing male, acute respiratory distress. Neuro:  Per, follows commands, drousy HEENT:  /AT, PERRL Cardiovascular:  Regular, sinus tach, Nl S1/S2 and -M/R/G. Lungs:  Diffuse wheezes, very poor air moveemnt Abdomen:  Soft, diffusely tender, ND and +BS. Musculoskeletal:  -edema and -tenderness. Skin:  Intact, scars noted.  LABS:  BMET  Recent Labs Lab 08/20/15 0455 08/21/15 0510 08/22/15 0507  NA 132* 133* 130*  K 3.3* 4.1  3.9  CL 103 99* 102  CO2 24 25 20*  BUN 8 14 32*  CREATININE 0.69 0.66 1.04  GLUCOSE 161* 191* 213*    Electrolytes  Recent Labs Lab 08/19/15 0350 08/20/15 0455 08/21/15 0510 08/22/15 0507  CALCIUM 8.1* 7.9* 8.2* 8.0*  MG 1.7 1.7 1.9 1.9  PHOS 1.9*  --  2.3* 2.9    CBC  Recent Labs Lab 08/20/15 0455 08/21/15 1026 08/22/15 0507  WBC 14.0* 19.0* 19.0*  HGB 8.4* 8.7* 8.1*  HCT 26.8* 29.1* 26.3*  PLT 121* 151 149*    Coag's  Recent Labs Lab 09/08/2015 2155  INR 1.40    Sepsis Markers  Recent Labs Lab 2015-08-18 0102 08/21/15 0555 08/21/15 1027  LATICACIDVEN 0.9 1.5 1.5    ABG  Recent Labs Lab 08/21/15 0943 08/21/15 1244 08/22/15 0320  PHART 7.252* 7.293* 7.374  PCO2ART 63.1* 42.1 37.2  PO2ART 353.0* 95.0 136*    Liver Enzymes  Recent Labs Lab Aug 18, 2015 0438 08/19/15 0350 08/21/15 0510  AST 70* 24 21  ALT 21 15* 13*  ALKPHOS 58 36* 46  BILITOT 0.9 0.6 0.5  ALBUMIN 2.2* 1.9* 2.0*    Cardiac Enzymes  Recent Labs Lab 09/08/2015 2240 2015-08-18 0438 2015/08/18 1032  TROPONINI 0.08* 0.09* 0.06*    Glucose  Recent Labs Lab 08/21/15 0354 08/21/15 1514 08/21/15 1933 08/22/15 0018 08/22/15 0310 08/22/15 0721  GLUCAP 159* 161* 182* 212* 198* 193*    Imaging Ct Angio Chest Pe W/cm &/or Wo Cm  08/21/2015  CLINICAL DATA:  Acute respiratory distress, abdominal distention, pt has Known AAA EXAM: CT ANGIOGRAPHY CHEST CT ABDOMEN and PELVIS WITH CONTRAST TECHNIQUE: Multidetector CT imaging of the chest, abdomen, pelvis was performed using the standard protocol during bolus administration of intravenous contrast. Multiplanar CT image reconstructions and MIPs were obtained to evaluate the vascular anatomy. CONTRAST:  100mL OMNIPAQUE IOHEXOL 350 MG/ML SOLN COMPARISON:  Nov 16, 2015 FINDINGS: CHEST Vascular: Injection via right arm PICC. There is mild reflux of contrast from the right atrium into the IVC. The RV is nondilated. Satisfactory opacification  of pulmonary arteries noted, and there is no evidence of pulmonary emboli. Patent bilateral pulmonary veins. Scattered coronary calcifications. Previous CABG. Adequate contrast opacification of the thoracic aorta with no evidence of aneurysm or stenosis. There is classic 3-vessel brachiocephalic arch anatomy without proximal stenosis. Atheromatous plaque in the arch and descending thoracic aorta. Penetrating atheromatous ulcer in the distal descending aorta on the right, and a short-segment dissection at the same level on the left, stable since previous exam. Mediastinum/Lymph Nodes: Subcentimeter AP window, prevascular, pretracheal, and precarinal lymph nodes. Endotracheal tube terminates in the distal trachea. No hilar adenopathy is identified. No pericardial effusion. Lungs/Pleura: Small pleural effusions left greater than right. Atelectasis/consolidation posteriorly in both lower lobes, left worse than right. Extensive emphysematous changes throughout both lungs with multiple subpleural blebs. Partially calcified 11 mm granuloma in the anterior right middle lobe. Musculoskeletal: Spurring in the mid and lower thoracic spine. Healed median sternotomy with sternotomy wires intact. ABDOMEN Vascular: Scattered calcified plaque in the suprarenal aorta. 10 cm fusiform infrarenal aneurysm extending across the bifurcation, with contiguous right common iliac artery aneurysm measured up to 4.3 cm diameter. The left internal iliac artery is ectatic up to 18 mm diameter. There is some nonobstructing eccentric mural thrombus in the aneurysmal segments of the aorta and right common iliac artery. No dissection. No retroperitoneal hematoma. Portal vein patent. IVC patent. Bilateral pelvic phleboliths. Hepatobiliary: No masses or other significant abnormality. Pancreas: No mass, inflammatory changes, or other significant abnormality. Spleen: Within normal limits in size and appearance. Adrenals/Urinary Tract: No masses  identified. No evidence of hydronephrosis. Stomach/Bowel: Nasogastric tube extends to the gastric antrum. Stomach is fluid distended with some high-density contrast posteriorly resulting in significant streak artifact. Small bowel and colon are nondilated, with scattered oral contrast material. Normal appendix. One wall of the cecum protrudes into a right inguinal hernia, without evidence of obstruction or strangulation. A rectal tube with balloon is in place. Lymphatic: No pathologically enlarged lymph nodes. Reproductive: No mass or other significant abnormality. Other: Right inguinal hernia involving a portion of the cecum. No ascites. No free air. No retroperitoneal hematoma. Musculoskeletal: Mild spondylitic changes throughout the lumbar spine. IMPRESSION: 1. Negative for acute PE. 2. Advanced emphysema. New bilateral small pleural effusions and lower lobe atelectasis/consolidation, left greater than right. 3. Short segment dissection/penetrating atheromatous ulcers in the distal descending thoracic aorta (DeBakey III, Stanford B), stable since previous. 4. Stable 10 cm infrarenal aortic aneurysm and contiguous 4.3 cm right common iliac artery aneurysm. 5. Right inguinal hernia involving a portion of the cecum, without obstruction or strangulation. Electronically Signed   By: Corlis Leak  Hassell M.D.   On: 08/21/2015 12:27   Ct Abdomen Pelvis W Contrast  08/21/2015  CLINICAL DATA:  Acute respiratory distress, abdominal distention, pt has Known AAA EXAM: CT ANGIOGRAPHY CHEST CT ABDOMEN and PELVIS WITH CONTRAST TECHNIQUE: Multidetector CT imaging of the chest, abdomen, pelvis was performed using the standard protocol during bolus  administration of intravenous contrast. Multiplanar CT image reconstructions and MIPs were obtained to evaluate the vascular anatomy. CONTRAST:  OMNIPAQUE IOHEXOL 350 MG/ML SOLN COMPARISON:  2015-08-28 FINDINGS: CHEST Vascular: Injection via right arm PICC. There is mild reflux of  contrast from the right atrium into the IVC. The RV is nondilated. Satisfactory opacification of pulmonary arteries noted, and there is no evidence of pulmonary emboli. Patent bilateral pulmonary veins. Scattered coronary calcifications. Previous CABG. Adequate contrast opacification of the thoracic aorta with no evidence of aneurysm or stenosis. There is classic 3-vessel brachiocephalic arch anatomy without proximal stenosis. Atheromatous plaque in the arch and descending thoracic aorta. Penetrating atheromatous ulcer in the distal descending aorta on the right, and a short-segment dissection at the same level on the left, stable since previous exam. Mediastinum/Lymph Nodes: Subcentimeter AP window, prevascular, pretracheal, and precarinal lymph nodes. Endotracheal tube terminates in the distal trachea. No hilar adenopathy is identified. No pericardial effusion. Lungs/Pleura: Small pleural effusions left greater than right. Atelectasis/consolidation posteriorly in both lower lobes, left worse than right. Extensive emphysematous changes throughout both lungs with multiple subpleural blebs. Partially calcified 11 mm granuloma in the anterior right middle lobe. Musculoskeletal: Spurring in the mid and lower thoracic spine. Healed median sternotomy with sternotomy wires intact. ABDOMEN Vascular: Scattered calcified plaque in the suprarenal aorta. 10 cm fusiform infrarenal aneurysm extending across the bifurcation, with contiguous right common iliac artery aneurysm measured up to 4.3 cm diameter. The left internal iliac artery is ectatic up to 18 mm diameter. There is some nonobstructing eccentric mural thrombus in the aneurysmal segments of the aorta and right common iliac artery. No dissection. No retroperitoneal hematoma. Portal vein patent. IVC patent. Bilateral pelvic phleboliths. Hepatobiliary: No masses or other significant abnormality. Pancreas: No mass, inflammatory changes, or other significant abnormality.  Spleen: Within normal limits in size and appearance. Adrenals/Urinary Tract: No masses identified. No evidence of hydronephrosis. Stomach/Bowel: Nasogastric tube extends to the gastric antrum. Stomach is fluid distended with some high-density contrast posteriorly resulting in significant streak artifact. Small bowel and colon are nondilated, with scattered oral contrast material. Normal appendix. One wall of the cecum protrudes into a right inguinal hernia, without evidence of obstruction or strangulation. A rectal tube with balloon is in place. Lymphatic: No pathologically enlarged lymph nodes. Reproductive: No mass or other significant abnormality. Other: Right inguinal hernia involving a portion of the cecum. No ascites. No free air. No retroperitoneal hematoma. Musculoskeletal: Mild spondylitic changes throughout the lumbar spine. IMPRESSION: 1. Negative for acute PE. 2. Advanced emphysema. New bilateral small pleural effusions and lower lobe atelectasis/consolidation, left greater than right. 3. Short segment dissection/penetrating atheromatous ulcers in the distal descending thoracic aorta (DeBakey III, Stanford B), stable since previous. 4. Stable 10 cm infrarenal aortic aneurysm and contiguous 4.3 cm right common iliac artery aneurysm. 5. Right inguinal hernia involving a portion of the cecum, without obstruction or strangulation. Electronically Signed   By: Corlis Leak M.D.   On: 08/21/2015 12:27   Dg Chest Port 1 View  08/22/2015  CLINICAL DATA:  Hypoxia ; shortness of breath EXAM: PORTABLE CHEST 1 VIEW COMPARISON:  Chest radiograph and chest CT August 21, 2015 FINDINGS: Endotracheal tube tip is 2.6 cm above the carina. Central catheter tip is in the superior vena cava. Nasogastric tube tip and side port are in the distal stomach region. There is no appreciable pneumothorax. There is underlying emphysematous change. There is patchy airspace consolidation in the left lower lobe region. There is a small  left pleural effusion. There is no edema or consolidation currently apparent on the right. The heart size is normal. The pulmonary vascularity is stable, reflecting the underlying emphysematous change. No adenopathy evident. Patient is status post coronary artery bypass grafting. IMPRESSION: Tube and catheter positions as described without pneumothorax. Underlying emphysematous change. Patchy infiltrate left base region. Small left pleural effusion. Right lung clear. No change in cardiac silhouette. Electronically Signed   By: Bretta Bang III M.D.   On: 08/22/2015 07:27   Dg Chest Port 1 View  08/21/2015  CLINICAL DATA:  Encounter for intubation EXAM: PORTABLE CHEST 1 VIEW COMPARISON:  Portable exam 0916 hours compared to 0616 hours. FINDINGS: Endotracheal tube projects at inferior aspect of aortic arch, questionably 2.0 cm above carina, carina poorly localized. Nasogastric tube extends into stomach. RIGHT arm PICC line tip projects over SVC above cavoatrial junction. Post median sternotomy. BILATERAL perihilar infiltrates extending into both lung bases. No gross pleural effusion or pneumothorax. IMPRESSION: Persistent pulmonary infiltrates, little changed. Line and tube positions as above. Electronically Signed   By: Ulyses Southward M.D.   On: 08/21/2015 09:46     STUDIES:  3/13 CT- em[phesema, extensive, bilateral bases effusions 3/13 CT abdo stable 10 cm infrarenal AAA  CULTURES: C. Diff negative 3/10. GI Panel by PCR negative 3/9. MRSA positive on admission. Blood 3/13>>> STD screen negative. Blood 3/8 negative  ANTIBIOTICS: Flagyl Levaquin  SIGNIFICANT EVENTS: 3/13 intubated shock, 6 liters 3/14- tachy, distress, poor airmovement  LINES/TUBES: ETT 3/13>>> Right picc line on admission.  DISCUSSION: 82 year old male with ischemic colitis and AAA who develops a likely COPD exacerbation due to all the inflammatory mediators associated with ischemic colitis that deteriorates on the  morning of 3/13 and PCCM was consulted.  Patient was intubated.  ASSESSMENT / PLAN:  PULMONARY A: VDRF due to COPD as above. autopeeping - tachy P:   - Full vent support, drop rate stat 18, repeat abg on this  - ABG and CXR STAT - CXR ordered stat for changes - INcrease IV solumedrol. -BDer's  CARDIOVASCULAR A:  Septic shock AAA Tachycardia - autopeep? P:  - neo to goal 60 map - IVF 100 ml/hr NS- kvo, cvp 17 - CVP q4 -alter vent rate, abg etc ,see above -Bolus -ecg -cbc  RENAL A:   Hyponatremia,cvp 17 P:   - IVF resuscitation to kvo -bolus for autopeep -assess serum osm, urine na, urine osm, UA  GASTROINTESTINAL A:   Ischemic colitis per GI. Diarrhea. C-diff negative P:   - consider Tf re attempt, consider dc TPN - CT of the abdomen and pelvis without contrast reviewed  HEMATOLOGIC A:   Leukocytosis. P:  - CBC in AM. - Transfuse per ICU protocol.  INFECTIOUS A:   C diff negative. Concern for GI bacteria with ischemic colitis. P:   - F/U on culture. - Levaquin and flagyl  ENDOCRINE A:   Hyperglycemia   P:   - ISS. - CBG - consider start TF  NEUROLOGIC A:   AMS resolved Encephalopthy, ventr dyschrony P:   RASS goal: -2 - Versed PRN. - Fentanyl PRN to drip  Ccm time 35 min   Mcarthur Rossetti. Tyson Alias, MD, FACP Pgr: [phone] [facility]

## 2015-08-22 NOTE — Progress Notes (Addendum)
PARENTERAL NUTRITION CONSULT NOTE - FOLLOW UP  Pharmacy Consult for TPN Indication: Ischemic bowel  Allergies  Allergen Reactions  . Penicillins   . Sulindac     Patient Measurements: Height: 5\' 7"  (170.2 cm) Weight: 167 lb 5.3 oz (75.9 kg) IBW/kg (Calculated) : 66.1  Vital Signs: Temp: 98.3 F (36.8 C) (03/14 0724) Temp Source: Oral (03/14 0724) BP: 135/91 mmHg (03/14 0630) Pulse Rate: 122 (03/14 0630) Intake/Output from previous day: 03/13 0701 - 03/14 0700 In: 7599.7 [I.V.:3064.7; IV Piggyback:1100; TPN:3435.1] Out: 1350 [Urine:850; Emesis/NG output:500] Intake/Output from this shift:    Labs:  Recent Labs  08/20/15 0455 08/21/15 1026 08/22/15 0507  WBC 14.0* 19.0* 19.0*  HGB 8.4* 8.7* 8.1*  HCT 26.8* 29.1* 26.3*  PLT 121* 151 149*     Recent Labs  08/19/15 0900 08/20/15 0455 08/21/15 0510 08/22/15 0507  NA  --  132* 133* 130*  K  --  3.3* 4.1 3.9  CL  --  103 99* 102  CO2  --  24 25 20*  GLUCOSE  --  161* 191* 213*  BUN  --  8 14 32*  CREATININE  --  0.69 0.66 1.04  CALCIUM  --  7.9* 8.2* 8.0*  MG  --  1.7 1.9 1.9  PHOS  --   --  2.3* 2.9  PROT  --   --  5.9*  --   ALBUMIN  --   --  2.0*  --   AST  --   --  21  --   ALT  --   --  13*  --   ALKPHOS  --   --  46  --   BILITOT  --   --  0.5  --   PREALBUMIN 4.8*  --  5.5*  --   TRIG  --  50 59  --   CHOLHDL  --  2.7  --   --   CHOL  --  65  --   --    Estimated Creatinine Clearance: 50.3 mL/min (by C-G formula based on Cr of 1.04).    Recent Labs  08/21/15 1933 08/22/15 0018 08/22/15 0310  GLUCAP 182* 212* 198*    Medications:  Scheduled:  . antiseptic oral rinse  7 mL Mouth Rinse BID  . antiseptic oral rinse  7 mL Mouth Rinse QID  . atropine  1 drop Right Eye BID  . chlorhexidine gluconate  15 mL Mouth Rinse BID  . insulin aspart  0-9 Units Subcutaneous 6 times per day  . ipratropium-albuterol  3 mL Nebulization QID  . levofloxacin (LEVAQUIN) IV  750 mg Intravenous Q24H  .  metronidazole  500 mg Intravenous 3 times per day  . mometasone-formoterol  2 puff Inhalation BID  . pantoprazole (PROTONIX) IV  40 mg Intravenous Q24H  . pravastatin  40 mg Oral q1800  . regadenoson  0.4 mg Intravenous Once  . saccharomyces boulardii  250 mg Oral BID  . sodium chloride flush  10-40 mL Intracatheter Q12H  . sodium chloride flush  3 mL Intravenous Q12H    Insulin Requirements in the past 24 hours:  12 units Sens SSI in the last 24 hrs  Current Nutrition:  NPO  Assessment: 82yo male admitted 3/7 with bloody stools and suspected colitis. CT at that time (+)massive AAA and decreasing Hg over first 24hr. Biopsies from sigmoidoscopy on 3/9 are c/w ischemic colitis; BMs are no longer bloody reflecting resolving ischemia. He is to start TPN  for continued bowel rest.  Surgeries/Procedures: 3/9- Sigmoidoscopy For AAA repair in the coming week  GI: NPO since 3/7 for Ischemic bowel. Rectal tube for frequent non-bloody BMs. CDiff & enteric pathogen panel negative.  No further stools noted in flow sheet. 500 cc from NG yesterday Endo: No hx DM. CBGs 180-220 on ssi Lytes: K 3.9, Mg 1.9 up, CoCa 9.6, Phos 2.9 Renal: Cr 0.66 > 1, UOP 0.5 ml/kg/hr, urinary retention.6L+ yesterday. Fluids at kvo Pulm: Hx COPD, on 2L at home. DuoNeb, Dulera, Mucinex. Trf to ICU 3/13 > required intubation  Cards: Good MAPs, on Neo @ 30 Hepatobil: LFTs wnl/low. TG 59 ok ID: Metronidazole and Levofloxacin for ischemic colitis + Florastor. AFeb. WBC 14  Best Practices: SCDs, MC TPN Access: PICC 3/10 TPN start date: 3/11 >>  Nutritional Goals:  1750-2000 kCal, 85-100 grams of protein per day Goal rate 29ml/hr to provide 1840kcal and 96g protein  Plan:  Continue Clinimix-E 5/15 at 45ml/hr + multivitamins + trace elements IV lipids 28ml/hr (231ml/day) Pt will be meeting 100% of kCal goal and 100% of AA goal Sensitive SSI q4h -- consider increasing to moderate  SSI    Agapito Games, PharmD, BCPS Clinical Pharmacist 08/22/2015 7:57 AM     Addendum -TPN will be stopped in this patient, initiating TF -Reduce rate to 40 ml/hr x 4 hours then turn off -Will sign off, please re-consult as needed   Baldemar Friday  08/22/2015 9:44 AM

## 2015-08-23 ENCOUNTER — Inpatient Hospital Stay (HOSPITAL_COMMUNITY): Payer: Medicaid Other

## 2015-08-23 ENCOUNTER — Encounter (HOSPITAL_COMMUNITY): Admission: EM | Disposition: E | Payer: Self-pay | Source: Home / Self Care | Attending: Family Medicine

## 2015-08-23 DIAGNOSIS — K55039 Acute (reversible) ischemia of large intestine, extent unspecified: Principal | ICD-10-CM

## 2015-08-23 DIAGNOSIS — J9602 Acute respiratory failure with hypercapnia: Secondary | ICD-10-CM | POA: Insufficient documentation

## 2015-08-23 DIAGNOSIS — E872 Acidosis: Secondary | ICD-10-CM

## 2015-08-23 LAB — BASIC METABOLIC PANEL
ANION GAP: 9 (ref 5–15)
BUN: 50 mg/dL — ABNORMAL HIGH (ref 6–20)
CALCIUM: 8.5 mg/dL — AB (ref 8.9–10.3)
CO2: 20 mmol/L — ABNORMAL LOW (ref 22–32)
CREATININE: 1.42 mg/dL — AB (ref 0.61–1.24)
Chloride: 101 mmol/L (ref 101–111)
GFR calc Af Amer: 51 mL/min — ABNORMAL LOW (ref >60)
GFR, EST NON AFRICAN AMERICAN: 44 mL/min — AB (ref >60)
GLUCOSE: 131 mg/dL — AB (ref 65–99)
Potassium: 4.7 mmol/L (ref 3.5–5.1)
Sodium: 130 mmol/L — ABNORMAL LOW (ref 135–145)

## 2015-08-23 LAB — CBC WITH DIFFERENTIAL/PLATELET
BASOS ABS: 0 10*3/uL (ref 0.0–0.1)
BASOS PCT: 0 %
EOS ABS: 0 10*3/uL (ref 0.0–0.7)
Eosinophils Relative: 0 %
HCT: 24.8 % — ABNORMAL LOW (ref 39.0–52.0)
Hemoglobin: 8.2 g/dL — ABNORMAL LOW (ref 13.0–17.0)
LYMPHS ABS: 0.6 10*3/uL — AB (ref 0.7–4.0)
LYMPHS PCT: 3 %
MCH: 28.2 pg (ref 26.0–34.0)
MCHC: 33.1 g/dL (ref 30.0–36.0)
MCV: 85.2 fL (ref 78.0–100.0)
MONO ABS: 0.4 10*3/uL (ref 0.1–1.0)
Monocytes Relative: 2 %
NEUTROS ABS: 20.2 10*3/uL — AB (ref 1.7–7.7)
Neutrophils Relative %: 95 %
PLATELETS: 149 10*3/uL — AB (ref 150–400)
RBC: 2.91 MIL/uL — ABNORMAL LOW (ref 4.22–5.81)
RDW: 14.2 % (ref 11.5–15.5)
WBC: 21.2 10*3/uL — ABNORMAL HIGH (ref 4.0–10.5)

## 2015-08-23 LAB — OSMOLALITY: OSMOLALITY: 289 mosm/kg (ref 275–295)

## 2015-08-23 LAB — GLUCOSE, CAPILLARY
GLUCOSE-CAPILLARY: 126 mg/dL — AB (ref 65–99)
GLUCOSE-CAPILLARY: 122 mg/dL — AB (ref 65–99)
Glucose-Capillary: 117 mg/dL — ABNORMAL HIGH (ref 65–99)
Glucose-Capillary: 117 mg/dL — ABNORMAL HIGH (ref 65–99)
Glucose-Capillary: 118 mg/dL — ABNORMAL HIGH (ref 65–99)
Glucose-Capillary: 122 mg/dL — ABNORMAL HIGH (ref 65–99)

## 2015-08-23 SURGERY — IABP INSERTION
Anesthesia: LOCAL

## 2015-08-23 MED ORDER — LEVOFLOXACIN IN D5W 750 MG/150ML IV SOLN: 750.0000 mg | INTRAVENOUS | Status: DC

## 2015-08-23 MED ORDER — HEPARIN SODIUM (PORCINE) 5000 UNIT/ML IJ SOLN
5000.0000 [IU] | Freq: Three times a day (TID) | INTRAMUSCULAR | Status: DC
  Administered 2015-08-23 – 2015-08-25 (×6): 5000 [IU] via SUBCUTANEOUS
  Filled 2015-08-23 (×7): qty 1

## 2015-08-23 MED ORDER — SODIUM CHLORIDE 0.9 % IV BOLUS (SEPSIS)
1000.0000 mL | Freq: Once | INTRAVENOUS | Status: AC
  Administered 2015-08-23: 1000 mL via INTRAVENOUS

## 2015-08-23 NOTE — Progress Notes (Signed)
PULMONARY / CRITICAL CARE MEDICINE   Name: Geovany Trudo MRN: [id number] DOB: 04-27-1930    ADMISSION DATE:  08/18/2015 CONSULTATION DATE:  08/21/2015  REFERRING MD:  Danise Edge  CHIEF COMPLAINT:  VDRF  HISTORY OF PRESENT ILLNESS:   82 year old male with extensive PMH who presents to the hospital with multiple complaints to include AAA that VVS feels need to be operated on when more stable, GI bleeding that was evaluated by GI and bx consistent with ischemic colitis and CP that was evaluated by CVTS and recommended repair of the AAA via VVS.  On 3/13, patient developed acute hypercarbic respiratory failure likely acute exacerbation of COPD with all the associated inflammatory issues due to ischemic colitis.  PCCM was consulted and patient was immediately intubated.  There is no contact family numbers for patient.  So full code for now.  SUBJECTIVE:  Remains on vent, tachy improved, autopeep improved  VITAL SIGNS: BP 91/67 mmHg  Pulse 110  Temp(Src) 98.3 F (36.8 C) (Oral)  Resp 18  Ht  (1.702 m)  Wt 76.2 kg (167 lb 15.9 oz)  BMI 26.30 kg/m2  SpO2 100%  HEMODYNAMICS: CVP:  [9 mmHg-12 mmHg] 9 mmHg  VENTILATOR SETTINGS: Vent Mode:  [-] PCV FiO2 (%):  [40 %] 40 % Set Rate:  [18 bmp] 18 bmp PEEP:  [5 cmH20] 5 cmH20 Plateau Pressure:  [14 cmH20-30 cmH20] 18 cmH20  INTAKE / OUTPUT: I/O last 3 completed shifts: In: 5347.1 [I.V.:2804.9; NG/GT:341.5; IV Piggyback:550] Out: 955 [Urine:955]  PHYSICAL EXAMINATION: General:  Chronically ill appearing male, syschrony Neuro:  FC, rass 0 HEENT:  /AT, PERRL Cardiovascular:  Regular, sinus tach improved S1/S2 and -M/R/G. Lungs:  Reduce, overall better air entry Abdomen:  Soft, diffusely tender, ND and +BS. Musculoskeletal:   genn edema and -tenderness. Skin:  Intact, scars noted.  LABS:  BMET  Recent Labs Lab 08/21/15 0510 08/22/15 0507 09/08/2015 0345  NA 133* 130* 130*  K 4.1 3.9 4.7  CL 99* 102 101  CO2 25 20* 20*  BUN  14 32* 50*  CREATININE 0.66 1.04 1.42*  GLUCOSE 191* 213* 131*    Electrolytes  Recent Labs Lab 08/19/15 0350 08/20/15 0455 08/21/15 0510 08/22/15 0507 09/03/2015 0345  CALCIUM 8.1* 7.9* 8.2* 8.0* 8.5*  MG 1.7 1.7 1.9 1.9  --   PHOS 1.9*  --  2.3* 2.9  --     CBC  Recent Labs Lab 08/22/15 0507 08/22/15 0937 09/01/2015 0345  WBC 19.0* 18.5* 21.2*  HGB 8.1* 8.0* 8.2*  HCT 26.3* 25.7* 24.8*  PLT 149* 137* 149*    Coag's No results for input(s): APTT, INR in the last 168 hours.  Sepsis Markers  Recent Labs Lab 08/21/15 0555 08/21/15 1027 08/22/15 0940  LATICACIDVEN 1.5 1.5 1.4    ABG  Recent Labs Lab 08/22/15 0320 08/22/15 0926 08/22/15 1027  PHART 7.374 7.321* 7.323*  PCO2ART 37.2 46.0* 39.6  PO2ART 136* 123.0* 99.0    Liver Enzymes  Recent Labs Lab 08/19/15 0350 08/21/15 0510  AST 24 21  ALT 15* 13*  ALKPHOS 36* 46  BILITOT 0.6 0.5  ALBUMIN 1.9* 2.0*    Cardiac Enzymes No results for input(s): TROPONINI, PROBNP in the last 168 hours.  Glucose  Recent Labs Lab 08/22/15 1612 08/22/15 2223 08/11/2015 0012 08/15/2015 0328 08/24/2015 0736 08/14/2015 1123  GLUCAP 152* 112* 122* 117* 118* 117*    Imaging Dg Chest Port 1 View  08/13/2015  CLINICAL DATA:  Hypoxia EXAM: PORTABLE CHEST  1 VIEW COMPARISON:  August 22, 2015 FINDINGS: Endotracheal tube tip is 3.8 cm above the carina. Nasogastric tube tip and side port below the diaphragm. Central catheter tip is in the superior vena cava. No apparent pneumothorax. There remains underlying emphysematous change. Patchy opacity in the left base is stable. No new opacity is seen elsewhere. The heart size is within normal limits. Pulmonary vascularity is stable. No adenopathy evident. IMPRESSION: Tube and catheter positions as described without pneumothorax. Patchy airspace disease left base stable. No new opacity. No change in cardiac silhouette. Underlying emphysematous change is again noted. Electronically  Signed   By: Bretta BangWilliam  Woodruff III M.D.   On: 08/24/2015 07:27     STUDIES:  3/13 CT- em[phesema, extensive, bilateral bases effusions 3/13 CT abdo stable 10 cm infrarenal AAA  CULTURES: C. Diff negative 3/10. GI Panel by PCR negative 3/9. MRSA positive on admission. Blood 3/13>>> STD screen negative. Blood 3/8 negative  ANTIBIOTICS: Flagyl 3/7>>>3/15 Levaquin 3/7>>>3/15  SIGNIFICANT EVENTS: 3/13 intubated shock, 6 liters 3/14- tachy, distress, poor air movement 3/15- improved air movement, tachy resolved  LINES/TUBES: ETT 3/13>>> Right picc line on admission.  DISCUSSION: 82 year old male with ischemic colitis and AAA who develops a likely COPD exacerbation due to all the inflammatory mediators associated with ischemic colitis that deteriorates on the morning of 3/13 and PCCM was consulted.  Patient was intubated.  ASSESSMENT / PLAN:  PULMONARY A: VDRF due to COPD as above. autopeeping - tachy P:   - RR low as able -PS 10 did hours, reduce PS 8 if able - CXR in am  - keep and reduce in am , seem to respond -BDer's  CARDIOVASCULAR A:  Septic shock AAA Tachycardia - resolving He appears volume depleted on all urine studies, await echo, PA ? cvp inaccurate? P:  - neo to goal 60 map or sys 90 -tele -go fluid Dc cvp Await echo  RENAL A:   Hyponatremia,cvp 17 , all urine studies = intravasc depleeted, r/o pa htn affect on cvp P:   Fluid to 100, bolus Chem inam   GASTROINTESTINAL A:   Ischemic colitis per GI. Diarrhea. C-diff negative P:   - tf advance  HEMATOLOGIC A:   Leukocytosis. P:  - CBC in AM. - Transfuse per ICU protocol. -sub q hep add  INFECTIOUS A:   C diff negative. Concern for GI bacteria with ischemic colitis. P:   - Levaquin and flagyl- dc today, day 9  ENDOCRINE A:   Hyperglycemia   P:   - ISS.  NEUROLOGIC A:   AMS resolved Encephalopthy, ventr dyschrony P:   RASS goal: 0 - Versed PRN. - Fentanyl PRN to  drip WUA  Ccm time 35 min   Mcarthur Rossettianiel J. Tyson AliasFeinstein, MD, FACP Pgr: [phone] [facility]

## 2015-08-23 NOTE — Progress Notes (Signed)
SLP Cancellation Note  Patient Details Name: Micheal Orozco MRN: [id number] DOB: 12/07/1929   Cancelled treatment:       Reason Eval/Treat Not Completed: Medical issues which prohibited therapy  Ferdinand LangoLeah Henny Strauch MA, CCC-SLP [phone]  Ferdinand LangoMcCoy Merari Pion Meryl 08/24/2015, 8:17 AM

## 2015-08-23 NOTE — Progress Notes (Signed)
   08/17/2015 0900  Clinical Encounter Type  Visited With Family;Patient not available;Health care provider  Visit Type Initial;Other (Comment) (Question on ADV DIR)  Referral From Nurse  Spiritual Encounters  Spiritual Needs Emotional;Other (Comment) (Information on Legal Issues re: HCPOA)  CH responded to request by RN to discuss legal issues regarding HCPOA with niece; Explained that pt needs to be alert and that HCPOA is pt driven; complication that pt is ward of State.  Doubtful that this will be resolved; Social Work should help if possible and provide additional information. Erline LevineMichael I Lisvet Rasheed 9:15 AM

## 2015-08-23 NOTE — Progress Notes (Signed)
CSW engaged with Patient and Patient's niece at Patient's bedside as Patient's niece had questions regarding HCPOA. CSW explained to Patient's niece that Patient would need to be alert and oriented in order to give consent for HCPOA and that Patient is currently sedated and on the ventilator. CSW explained that Patient  would need to initiate HCPOA as it is a legal patient driven document. CSW also explained to Patient's niece that with Patient being an inmate/ward of the state, The Department of Corrections Utilization Board is responsible for making his medical decisions at this time. CSW explained to Patient's niece that she will need to contact the Department of Corrections [employer](Prison Guard present reports that he gave her the contact information) with her requests and concerns. Patient's niece expressed concern that Patient will not be comfortable if the decision is made for him to go comfort care. She reports fear that Patient will not have anything to eat or drink. CSW ensured Patient's niece that Patient would be made as comfortable as possible if the decision is made to transition to comfort care. CSW also expressed concerns to Patient's RN. Patient's niece reports that she has received mixed information from Patient's RNs regarding what information she can obtain regarding his health and prognosis. CSW will clarify with medical team at rounds regarding what information can be provided to Patient's niece. CSW will continue to follow.   Noe GensAshley Gardner, LCSW [facility] Clinical Social Worker [phone]

## 2015-08-24 ENCOUNTER — Other Ambulatory Visit (HOSPITAL_COMMUNITY)

## 2015-08-24 DIAGNOSIS — R1011 Right upper quadrant pain: Secondary | ICD-10-CM

## 2015-08-24 DIAGNOSIS — I713 Abdominal aortic aneurysm, ruptured: Secondary | ICD-10-CM

## 2015-08-24 LAB — GLUCOSE, CAPILLARY
GLUCOSE-CAPILLARY: 126 mg/dL — AB (ref 65–99)
GLUCOSE-CAPILLARY: 146 mg/dL — AB (ref 65–99)
GLUCOSE-CAPILLARY: 71 mg/dL (ref 65–99)
Glucose-Capillary: 116 mg/dL — ABNORMAL HIGH (ref 65–99)
Glucose-Capillary: 132 mg/dL — ABNORMAL HIGH (ref 65–99)
Glucose-Capillary: 134 mg/dL — ABNORMAL HIGH (ref 65–99)

## 2015-08-24 LAB — COMPREHENSIVE METABOLIC PANEL
ALT: 14 U/L — ABNORMAL LOW (ref 17–63)
ANION GAP: 8 (ref 5–15)
AST: 23 U/L (ref 15–41)
Albumin: 1.7 g/dL — ABNORMAL LOW (ref 3.5–5.0)
Alkaline Phosphatase: 28 U/L — ABNORMAL LOW (ref 38–126)
BILIRUBIN TOTAL: 0.3 mg/dL (ref 0.3–1.2)
BUN: 58 mg/dL — ABNORMAL HIGH (ref 6–20)
CHLORIDE: 105 mmol/L (ref 101–111)
CO2: 20 mmol/L — ABNORMAL LOW (ref 22–32)
Calcium: 8.4 mg/dL — ABNORMAL LOW (ref 8.9–10.3)
Creatinine, Ser: 1.26 mg/dL — ABNORMAL HIGH (ref 0.61–1.24)
GFR calc Af Amer: 59 mL/min — ABNORMAL LOW (ref >60)
GFR, EST NON AFRICAN AMERICAN: 51 mL/min — AB (ref >60)
Glucose, Bld: 141 mg/dL — ABNORMAL HIGH (ref 65–99)
POTASSIUM: 4.5 mmol/L (ref 3.5–5.1)
Sodium: 133 mmol/L — ABNORMAL LOW (ref 135–145)
TOTAL PROTEIN: 4.8 g/dL — AB (ref 6.5–8.1)

## 2015-08-24 LAB — CBC WITH DIFFERENTIAL/PLATELET
BASOS ABS: 0 10*3/uL (ref 0.0–0.1)
Basophils Relative: 0 %
Eosinophils Absolute: 0 10*3/uL (ref 0.0–0.7)
Eosinophils Relative: 0 %
HEMATOCRIT: 22.5 % — AB (ref 39.0–52.0)
HEMOGLOBIN: 7.3 g/dL — AB (ref 13.0–17.0)
LYMPHS ABS: 0.7 10*3/uL (ref 0.7–4.0)
LYMPHS PCT: 5 %
MCH: 27.4 pg (ref 26.0–34.0)
MCHC: 32.4 g/dL (ref 30.0–36.0)
MCV: 84.6 fL (ref 78.0–100.0)
Monocytes Absolute: 0.2 10*3/uL (ref 0.1–1.0)
Monocytes Relative: 2 %
NEUTROS ABS: 12.7 10*3/uL — AB (ref 1.7–7.7)
NEUTROS PCT: 93 %
PLATELETS: 151 10*3/uL (ref 150–400)
RBC: 2.66 MIL/uL — AB (ref 4.22–5.81)
RDW: 14.2 % (ref 11.5–15.5)
WBC: 13.5 10*3/uL — AB (ref 4.0–10.5)

## 2015-08-24 LAB — CHLAMYDIA CULTURE: Chlamydia Trachomatis Culture: NEGATIVE

## 2015-08-24 MED ORDER — METHYLPREDNISOLONE SODIUM SUCC 40 MG IJ SOLR
40.0000 mg | Freq: Two times a day (BID) | INTRAMUSCULAR | Status: DC
  Administered 2015-08-24 – 2015-08-25 (×2): 40 mg via INTRAVENOUS
  Filled 2015-08-24 (×4): qty 1

## 2015-08-24 MED ORDER — METOPROLOL TARTRATE 25 MG/10 ML ORAL SUSPENSION
25.0000 mg | Freq: Two times a day (BID) | ORAL | Status: DC
  Administered 2015-08-24 (×2): 25 mg
  Filled 2015-08-24 (×5): qty 10

## 2015-08-24 MED ORDER — WHITE PETROLATUM GEL
Status: AC
  Filled 2015-08-24: qty 1

## 2015-08-24 NOTE — Procedures (Signed)
Extubation Procedure Note  Patient Details:   Name: Micheal Orozco DOB: 07/14/1929 MRN: [id number]   Airway Documentation:     Evaluation  O2 sats: stable throughout Complications: No apparent complications Patient did tolerate procedure well. Bilateral Breath Sounds: Expiratory wheezes Suctioning: Oral, Airway Yes   Patient extubated to 2L nasal cannula per MD order.  Positive cuff leak noted.  No evidence of stridor.  Patient able to speak post extubation.  Sats currently 100%.  Vitals are stable.  Incentive spirometry performed x5 with achieved goal of 600.  No apparent complications.   Durwin GlazeBrown, Alesia Oshields N 08/24/2015, 2:27 PM

## 2015-08-24 NOTE — Progress Notes (Signed)
[facility] Progress Note  Subjective    Patient alert and intubated. Shakes head to abdominal pain.   Objective Filed Vitals:   08/24/15 0945 08/24/15 1030  BP: 107/69 109/66  Pulse: 141 132  Temp:    Resp: 18 14    Intake/Output Summary (Last 24 hours) at 08/24/15 1106 Last data filed at 08/24/15 1000  Gross per 24 hour  Intake 3999.87 ml  Output   1130 ml  Net 2869.87 ml   Alert, intubated.  Abdomen distended. Does not grimace to palpation.    Assessment/Planning: 82 y.o. male with large 10.1 cm AAA without rupture, VDRF, ischemic colitis.   Abdomen non tender on exam.  Still requiring vent support.  Not a candidate for surgery currently.  Will follow from sidelines.   Raymond Gurney 08/24/2015 11:06 AM --  Laboratory CBC    Component Value Date/Time   WBC 13.5* 08/24/2015 0400   HGB 7.3* 08/24/2015 0400   HCT 22.5* 08/24/2015 0400   PLT 151 08/24/2015 0400    BMET    Component Value Date/Time   NA 133* 08/24/2015 0400   K 4.5 08/24/2015 0400   CL 105 08/24/2015 0400   CO2 20* 08/24/2015 0400   GLUCOSE 141* 08/24/2015 0400   BUN 58* 08/24/2015 0400   CREATININE 1.26* 08/24/2015 0400   CALCIUM 8.4* 08/24/2015 0400   GFRNONAA 51* 08/24/2015 0400   GFRAA 59* 08/24/2015 0400    COAG Lab Results  Component Value Date   INR 1.40 08/20/2015   No results found for: PTT  Antibiotics Anti-infectives    Start     Dose/Rate Route Frequency Ordered Stop   08/24/15 1300  levofloxacin (LEVAQUIN) IVPB 750 mg  Status:  Discontinued     750 mg 100 mL/hr over 90 Minutes Intravenous Every 48 hours 08/24/2015 0730 08/14/2015 1411   08/18/15 1430  levofloxacin (LEVAQUIN) IVPB 750 mg  Status:  Discontinued     750 mg 100 mL/hr over 90 Minutes Intravenous Every 24 hours 08/18/15 1059 08/22/2015 0730   08/22/2015 1800  levofloxacin (LEVAQUIN) IVPB 750 mg  Status:  Discontinued     750 mg 100 mL/hr over 90 Minutes Intravenous Every 48 hours  September 12, 2015 0125 08/12/2015 1351   08/31/2015 1430  levofloxacin (LEVAQUIN) IVPB 500 mg  Status:  Discontinued     500 mg 100 mL/hr over 60 Minutes Intravenous Every 24 hours 09/05/2015 1351 08/18/15 1059   09/12/15 0400  metroNIDAZOLE (FLAGYL) IVPB 500 mg  Status:  Discontinued     500 mg 100 mL/hr over 60 Minutes Intravenous 3 times per day 09-12-2015 0125 08/10/2015 1411   08/29/2015 1830  metroNIDAZOLE (FLAGYL) IVPB 500 mg  Status:  Discontinued     500 mg 100 mL/hr over 60 Minutes Intravenous Every 8 hours 08/10/2015 1821 08/21/2015 2133   09/01/2015 1830  levofloxacin (LEVAQUIN) IVPB 750 mg  Status:  Discontinued     750 mg 100 mL/hr over 90 Minutes Intravenous Every 48 hours 08/22/2015 1823 08/31/2015 2133   08/31/2015 1745  ciprofloxacin (CIPRO) IVPB 400 mg  Status:  Discontinued     400 mg 200 mL/hr over 60 Minutes Intravenous  Once 09/04/2015 1732 09/04/2015 1823   08/18/2015 1745  metroNIDAZOLE (FLAGYL) IVPB 500 mg  Status:  Discontinued     500 mg 100 mL/hr over 60 Minutes Intravenous  Once 09/04/2015 1732 08/14/2015 1821   08/14/2015 1745  piperacillin-tazobactam (ZOSYN) IVPB 3.375 g  Status:  Discontinued  3.375 g 100 mL/hr over 30 Minutes Intravenous  Once 09/07/2015 1737 09/04/2015 1737   08/10/2015 1745  levofloxacin (LEVAQUIN) IVPB 750 mg  Status:  Discontinued     750 mg 100 mL/hr over 90 Minutes Intravenous  Once 08/24/2015 1738 08/20/2015 1811   08/14/2015 1745  metroNIDAZOLE (FLAGYL) IVPB 500 mg  Status:  Discontinued     500 mg 100 mL/hr over 60 Minutes Intravenous  Once 09/06/2015 1738 08/30/2015 1812       Maris BergerKimberly Branndon Tuite, PA-C [facility] Office: [phone] Pager: [phone] 08/24/2015 11:06 AM

## 2015-08-24 NOTE — Progress Notes (Signed)
Fentanyl 200cc  Wasted in sink, witnesses by AetnaMichelle Toller.

## 2015-08-24 NOTE — Progress Notes (Signed)
PULMONARY / CRITICAL CARE MEDICINE   Name: Micheal Orozco MRN: [id number] DOB: December 29, 1929    ADMISSION DATE:  08/28/2015 CONSULTATION DATE:  08/21/2015  REFERRING MD:  Graciella Freer  CHIEF COMPLAINT:  VDRF  HISTORY OF PRESENT ILLNESS:   82 year old male with extensive PMH who presents to the hospital with multiple complaints to include AAA that VVS feels need to be operated on when more stable, GI bleeding that was evaluated by GI and bx consistent with ischemic colitis and CP that was evaluated by CVTS and recommended repair of the AAA via VVS.  On 3/13, patient developed acute hypercarbic respiratory failure likely acute exacerbation of COPD with all the associated inflammatory issues due to ischemic colitis.  PCCM was consulted and patient was immediately intubated.  There is no contact family numbers for patient.  So full code for now.  SUBJECTIVE:  Remains on vent, alert and follows commands. Tachy with auto peep.   VITAL SIGNS: BP 119/76 mmHg  Pulse 118  Temp(Src) 98.3 F (36.8 C) (Oral)  Resp 20  Ht _0  (1.702 m)  Wt 169 lb 12.1 oz (77 kg)  BMI 26.58 kg/m2  SpO2 100%  HEMODYNAMICS: CVP:  [9 mmHg-19 mmHg] 12 mmHg  VENTILATOR SETTINGS: Vent Mode:  [-] PSV;CPAP FiO2 (%):  [40 %] 40 % Set Rate:  [18 bmp] 18 bmp PEEP:  [5 cmH20] 5 cmH20 Pressure Support:  [5 cmH20] 5 cmH20 Plateau Pressure:  [22 cmH20-32 cmH20] 25 cmH20  INTAKE / OUTPUT: I/O last 3 completed shifts: In: 4378.4 [I.V.:2434.4; NG/GT:745; IV Piggyback:1199] Out: 1280 [Urine:1280]  PHYSICAL EXAMINATION: General:  Chronically ill appearing male, syschrony Neuro:  Alert, follows commands  HEENT:  Micheal Orozco/AT, 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 Lungs:  Expiratory wheezing, diminished breath sounds to base Abdomen:  Soft, diffusely tender, ND and +BS. Musculoskeletal:   +3 edema to extremities  and - tenderness. Skin:  Intact, scars  noted.  LABS:  BMET  Recent Labs Lab 08/22/15 0507 08/27/2015 0345 08/24/15 0400  NA 130* 130* 133*  K 3.9 4.7 4.5  CL 102 101 105  CO2 20* 20* 20*  BUN 32* 50* 58*  CREATININE 1.04 1.42* 1.26*  GLUCOSE 213* 131* 141*    Electrolytes  Recent Labs Lab 08/19/15 0350 08/20/15 0455 08/21/15 0510 08/22/15 0507 08/20/2015 0345 08/24/15 0400  CALCIUM 8.1* 7.9* 8.2* 8.0* 8.5* 8.4*  MG 1.7 1.7 1.9 1.9  --   --   PHOS 1.9*  --  2.3* 2.9  --   --     CBC  Recent Labs Lab 08/22/15 0937 08/19/2015 0345 08/24/15 0400  WBC 18.5* 21.2* 13.5*  HGB 8.0* 8.2* 7.3*  HCT 25.7* 24.8* 22.5*  PLT 137* 149* 151    Coag's No results for input(s): APTT, INR in the last 168 hours.  Sepsis Markers  Recent Labs Lab 08/21/15 0555 08/21/15 1027 08/22/15 0940  LATICACIDVEN 1.5 1.5 1.4    ABG  Recent Labs Lab 08/22/15 0320 08/22/15 0926 08/22/15 1027  PHART 7.374 7.321* 7.323*  PCO2ART 37.2 46.0* 39.6  PO2ART 136* 123.0* 99.0    Liver Enzymes  Recent Labs Lab 08/19/15 0350 08/21/15 0510 08/24/15 0400  AST _1 ALT 15* 13* 14*  ALKPHOS 36* 46 28*  BILITOT 0.6 0.5 0.3  ALBUMIN 1.9* 2.0* 1.7*    Cardiac Enzymes No results for input(s): TROPONINI, PROBNP in the last 168 hours.  Glucose  Recent Labs Lab 09/06/2015 1123 08/21/2015 1656 08/14/2015 1943 09/08/2015 2359 08/24/15 0320 08/24/15 0734  GLUCAP 117* 126* 122* 134* 116* 126*    Imaging No results found.   STUDIES:  3/13 CT- em[phesema, extensive, bilateral bases effusions 3/13 CT abdo stable 10 cm infrarenal AAA  CULTURES: C. Diff negative 3/10. GI Panel by PCR negative 3/9. MRSA positive on admission. Blood 3/13>>> STD screen negative. Blood 3/8 negative  ANTIBIOTICS: Flagyl 3/7>>>3/15 Levaquin 3/7>>>3/15  SIGNIFICANT EVENTS: 3/13 intubated shock, 6 liters 3/14- tachy, distress, poor air movement 3/15- improved air movement, tachy resolved 3/16 - Tachy mild, on BB at  home  LINES/TUBES: ETT 3/13>>> Right picc line on admission.  DISCUSSION: 82 year old male with ischemic colitis and AAA who develops a likely COPD exacerbation due to all the inflammatory mediators associated with ischemic colitis that deteriorates on the morning of 3/13 and PCCM was consulted.  Patient was intubated.  ASSESSMENT / PLAN:  PULMONARY A: VDRF due to COPD as above -  autopeeping - tachy P:   - RR low as able -PS 5/5 goal met , assess rsbi, abg - neg balance goals in future -BDer's  CARDIOVASCULAR A:  Septic shock AAA Tachycardia (BB WD?), intravascular depletion? He appears volume depleted on all urine studies, await echo, PA ? cvp inaccurate?  P:  -tele -restart home metoprolol  -Order Echo  -fluids allow pos  RENAL A:   Hyponatremia - improving  P:   NS @ 100 ml/hr Chem in am   GASTROINTESTINAL A:   Ischemic colitis per GI. Diarrhea. C-diff negative P:   - tf advance, hold for wenaing   HEMATOLOGIC A:   Anemia  Wbc improved P:  -CBC in AM. -Transfuse per ICU protocol. -sub q hep   INFECTIOUS A:   Leukocytosis - improving  C diff negative. Concern for GI bacteria with ischemic colitis. P:   - Trend WBC and fever curve   ENDOCRINE A:   Hyperglycemia   P:   - ISS.  NEUROLOGIC A:   AMS resolved Encephalopthy, ventr dyschrony P:   RASS goal: 0 - Versed PRN. - Fentanyl PRN WUA    STAFF NOTE: I, Merrie Roof, MD FACP have personally reviewed patient's available data, including medical history, events of note, physical examination and test results as part of my evaluation. I have discussed with resident/NP and other care providers such as pharmacist, RN and RRT. In addition, I personally evaluated patient and elicited key findings of: awake, FC on vent, responding to fluids last 24 hrs, was intravscaualr depleted, weaning now goal 5/5, 1 hr, asess rsbi, abg, upright, allow pos balance still, chem in am , may kvo in am ,  may need lasix in future, no ABX required, steroids to reduce 40 q12h, Bders, WBC improved, status overall is improved, MS supports extubation, get echo, add BB as likely BB WD  The patient is critically ill with multiple organ systems failure and requires high complexity decision making for assessment and support, frequent evaluation and titration of therapies, application of advanced monitoring technologies and extensive interpretation of multiple databases.   Critical Care Time devoted to patient care services described in this note is 30 Minutes. This time reflects time of care of this signee: Merrie Roof, MD FACP. This critical care time does not reflect procedure time, or teaching time or supervisory time of PA/NP/Med student/Med Resident etc but could involve care discussion time. Rest per NP/medical resident whose note is outlined above and that  I agree with   Lavon Paganini. Titus Mould, MD, [place] Pgr: [place] [facility] 08/24/2015 12:31 PM

## 2015-08-25 ENCOUNTER — Ambulatory Visit (HOSPITAL_COMMUNITY): Payer: Medicaid Other

## 2015-08-25 LAB — GLUCOSE, CAPILLARY
GLUCOSE-CAPILLARY: 99 mg/dL (ref 65–99)
GLUCOSE-CAPILLARY: 98 mg/dL (ref 65–99)
Glucose-Capillary: 89 mg/dL (ref 65–99)

## 2015-08-25 MED ORDER — MIDAZOLAM HCL 2 MG/2ML IJ SOLN
INTRAMUSCULAR | Status: AC
  Filled 2015-08-25: qty 2

## 2015-08-25 MED ORDER — FENTANYL CITRATE (PF) 100 MCG/2ML IJ SOLN
INTRAMUSCULAR | Status: AC
  Filled 2015-08-25: qty 2

## 2015-08-25 MED ORDER — ATROPINE SULFATE 0.1 MG/ML IJ SOLN
INTRAMUSCULAR | Status: AC
  Filled 2015-08-25: qty 10

## 2015-08-25 MED FILL — Medication: Qty: 1 | Status: AC

## 2015-08-26 LAB — CULTURE, BLOOD (ROUTINE X 2)
Culture: NO GROWTH
Culture: NO GROWTH

## 2015-09-09 NOTE — Procedures (Signed)
ACLS Coded ST segments looked elevated just prior acls followed Unable to resus Notified gard , they will notify family  Micheal RossettiDaniel J. Tyson AliasFeinstein, MD, FACP Pgr: [phone] [facility]

## 2015-09-09 NOTE — Procedures (Signed)
Echo no heart movement No carotid movement No sig effusion Limited Poor imaging Only got subcostal  Mcarthur Rossettianiel J. Tyson AliasFeinstein, MD, FACP Pgr: [phone] [place] [facility]

## 2015-09-09 NOTE — Progress Notes (Signed)
Physical therapist working with patient this morning. I was called to room by PT as pt became unresponsive to sternal rub. MD called to bedside. ACLS initiated per his orders.

## 2015-09-09 NOTE — Evaluation (Signed)
Physical Therapy Evaluation Patient Details Name: Micheal Orozco MRN: [id number] DOB: 1930-04-13 Today's Date: 09/02/2015   History of Present Illness  Micheal Orozco is a 82 y.o. male presenting with hematochezia 1 day. PMH is significant for COPD, CAD, H/o CABG, hypertension, glaucoma, macular degeneration. Found to have very large infrarenal abdominal aortic aneurysm. It is bilobed. Maximal AP and transverse diameters 9.7 and 10.1 cm.  Developed respiratory failure requiring intubation due to COPD and was extubated 08/24/15  Clinical Impression  Patient with severe weakness and limited tolerance to exercise with HR ranging 135-141 with rest breaks between bed exercises.  Developed stridor and decreased responsiveness during session and session terminated with MD and RN in to assess pt. No further therapy needs due to pt deceased.     Follow Up Recommendations No PT follow up    Equipment Recommendations  None recommended by PT    Recommendations for Other Services       Precautions / Restrictions Precautions Precautions: Fall      Mobility  Bed Mobility Overal bed mobility: Needs Assistance Bed Mobility: Rolling Rolling: Mod assist         General bed mobility comments: attempted rolling with assist to reach rail, due to leaking rectal tube and needing assist for cleaning, but pt became unresponsive and developed stridor, RN and MD made aware and RN and MD entered room and treatment terminated  Transfers                    Ambulation/Gait                Stairs            Wheelchair Mobility    Modified Rankin (Stroke Patients Only)       Balance                                             Pertinent Vitals/Pain Pain Assessment: Faces Faces Pain Scale: Hurts even more Pain Location: started c/o R hip pain during session though initially was without pain Pain Descriptors / Indicators: Aching Pain Intervention(s): Monitored  during session;Repositioned    Home Living Family/patient expects to be discharged to:: [facility]                      Prior Function Level of Independence: Needs assistance   Gait / Transfers Assistance Needed: transfered to w/c with assist, did not propel chair much           Hand Dominance        Extremity/Trunk Assessment   Upper Extremity Assessment: RUE deficits/detail;LUE deficits/detail RUE Deficits / Details: generally weak and with fluid collected more distally in both hands and forearms, AAROM limited by lines; strength at least 3/5     LUE Deficits / Details: generally weak and with fluid collected more distally in both hands and forearms, AAROM limited by lines, strength at least 3/5   Lower Extremity Assessment: RLE deficits/detail;LLE deficits/detail RLE Deficits / Details: AAROM grossly WFL, strength <3/5 throughout; edema in both feet and legs LLE Deficits / Details: AAROM grossly WFL, strength <3/5 throughout; edema in both feet and legs     Communication   Communication: No difficulties  Cognition Arousal/Alertness: Awake/alert Behavior During Therapy: WFL for tasks assessed/performed Overall Cognitive Status: Within Functional Limits for tasks assessed  General Comments      Exercises General Exercises - Upper Extremity Shoulder Flexion: AAROM;Both;5 reps General Exercises - Lower Extremity Ankle Circles/Pumps: AAROM;Both;5 reps;Supine Short Arc Quad: AAROM;Both;5 reps;Supine Heel Slides: 5 reps;Both;AAROM;Supine      Assessment/Plan    PT Assessment Patent does not need any further PT services  PT Diagnosis Generalized weakness   PT Problem List    PT Treatment Interventions     PT Goals (Current goals can be found in the Care Plan section) Acute Rehab PT Goals PT Goal Formulation: All assessment and education complete, DC therapy (due to pt deceased)    Frequency     Barriers to  discharge        Co-evaluation               End of Session   Activity Tolerance: Treatment limited secondary to medical complications (Comment) (pt unresponsive and vitals declining) Patient left: in bed;with nursing/sitter in room           Time: 1050-1105 PT Time Calculation (min) (ACUTE ONLY): 15 min   Charges:   PT Evaluation $PT Eval High Complexity: 1 Procedure     PT G CodesElray Mcgregor:        Athalee Esterline 08/12/2015, 5:43 PM  Sheran Lawlessyndi Shamir Sedlar, PT [phone] 08/22/2015

## 2015-09-09 NOTE — Procedures (Signed)
Intubation Procedure Note Micheal Orozco [id number] 04/01/1930  Procedure: Intubation Indications: Respiratory insufficiency  Procedure Details Consent: Unable to obtain consent because of emergent medical necessity. Time Out: Verified patient identification, verified procedure, site/side was marked, verified correct patient position, special equipment/implants available, medications/allergies/relevent history reviewed, required imaging and test results available.  Performed  Maximum sterile technique was used including gloves, gown and hand hygiene.  MAC and 4    Evaluation Hemodynamic Status: coded; O2 sats: coding Patient's Current Condition: unstable Complications: No apparent complications Patient did tolerate procedure well. Chest X-ray ordered to verify placement.  CXR: pending.   Nelda BucksFEINSTEIN,DANIEL J. 09/03/2015

## 2015-09-09 DEATH — deceased

## 2015-10-09 NOTE — Discharge Summary (Addendum)
NAME:  Dolphus JennyJONES, Jerimey               ACCOUNT NO.:  [id number]  MEDICAL RECORD NO.:  [id number]  LOCATION:  2M02C                        FACILITY:  [facility]  PHYSICIAN:  Nelda Bucksaniel J Feinstein, MD DATE OF BIRTH:  November 22, 1929  DATE OF ADMISSION:  08-18-15 DATE OF DISCHARGE:  08/14/2015                              DISCHARGE SUMMARY   DEATH SUMMARY  This is an 82 year old male, who has been in jail for about 30 to 35 years, who presented as a [facility] an [facility]  The patient had a prior history of AAA.  He presented to the hospital with multiple complaints including triple AAA concerns for which Vascular assessed the patient.  He had GI bleeding which was evaluated by the GI specialist.  He had a biopsy done for abdominal symptoms and signs consistent with ischemic colitis and had some chest pain evaluated by the CVTS Service recommended repair of the AAA versus a Vascular Surgery assessment.  On March 13, the patient developed acute hypercarbic respiratory failure, likely acute exacerbation of his COPD with associated inflammatory issues due to ischemic colitis.  PCM was consulted.  The patient required intubation, had some auto peeping on the ventilator machine and was treated aggressively for his COPD.  He was treated aggressively with antibiotics for his ischemic colitis, and the patient improved and was able to be extubated successfully on or about the 16th of March.  He was awake, he was alert, he was having no abdominal discomfort or chest discomfort.  At that time Physical Therapy was working with him on the 17th, and went into agonal respiratory effort and cardiac arrest.  The patient required re-intubation and resuscitation efforts were unable to resuscitate the patient after cardiac arrest, but just prior to arrest, we could note some ST-segment elevation on the telemetry monitoring, and the patient expired.  FINAL DIAGNOSES UPON DEATH: 1.  Acute myocardial infarction. 2. Cardiac arrest likely secondary to #1. 3. History of abdominal aortic aneurysm. 4. Chronic obstructive pulmonary disease exacerbation requiring     mechanical ventilation. 5. Acute ischemic colitis    Nelda Bucksaniel J Feinstein, MD     DJF/MEDQ  D:  09/11/2015  T:  09/11/2015  Job:  [id number]

## 2017-12-31 IMAGING — RF DG SWALLOWING FUNCTION - NRPT [facility]
1 series · 18 of 24 positions shown · non-contrast
Comparison: none

[Series 1: run · 15 acquisitions, 18 frames shown]
[im 1/15]
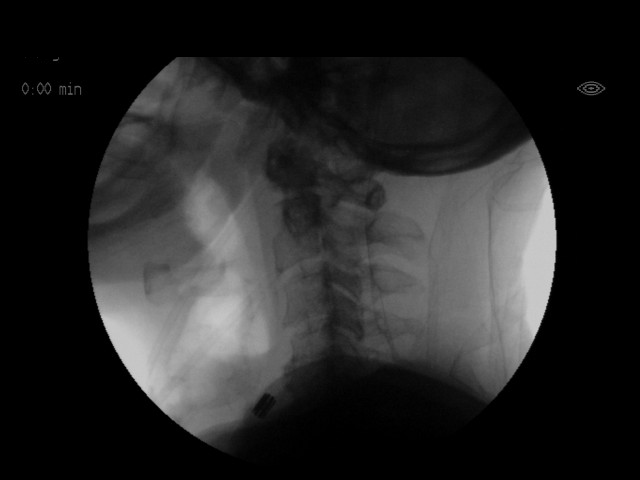
[im 2/15]
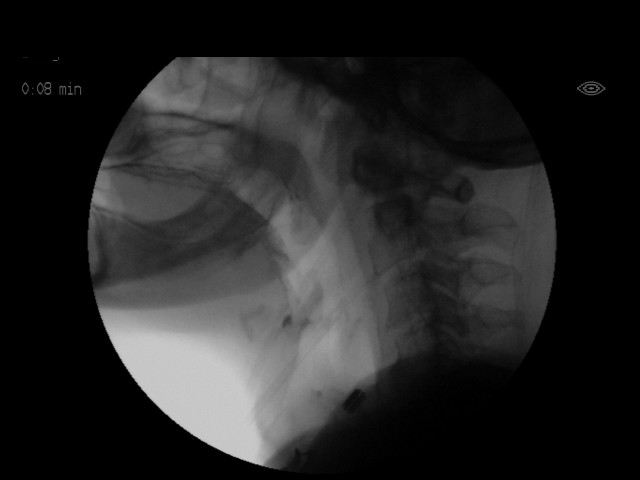
[im 3/15]
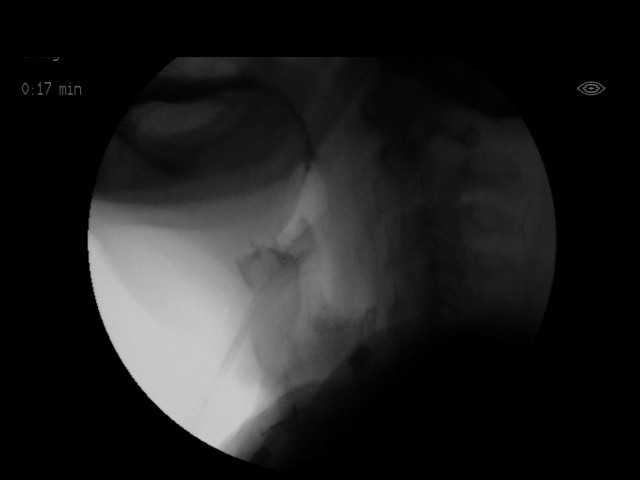
[im 3/15]
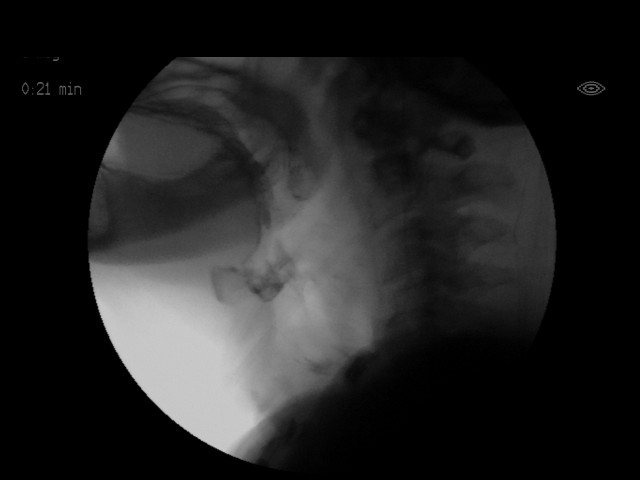
[im 4/15]
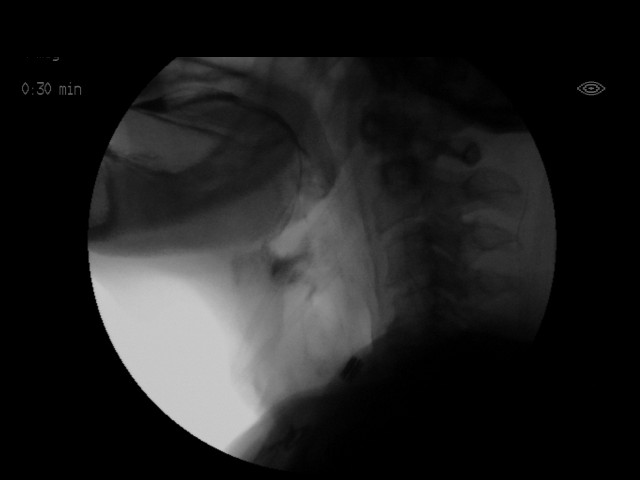
[im 5/15]
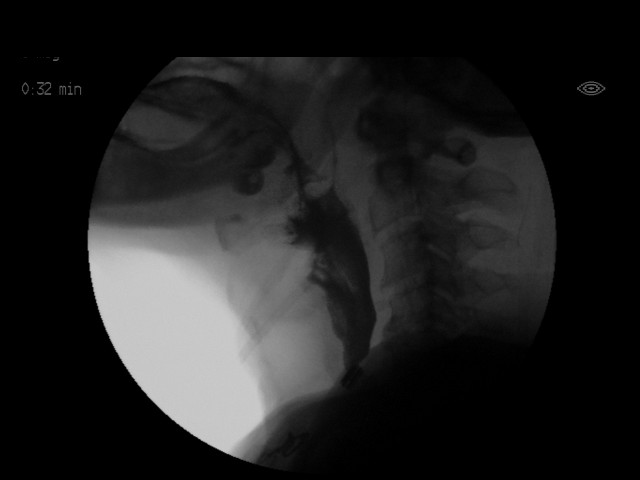
[im 6/15]
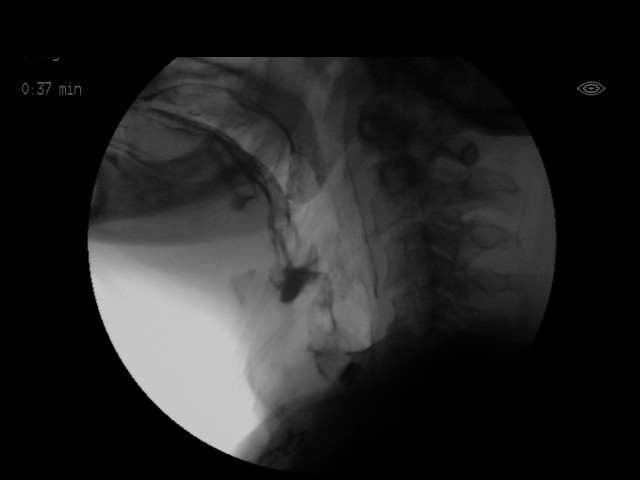
[im 7/15]
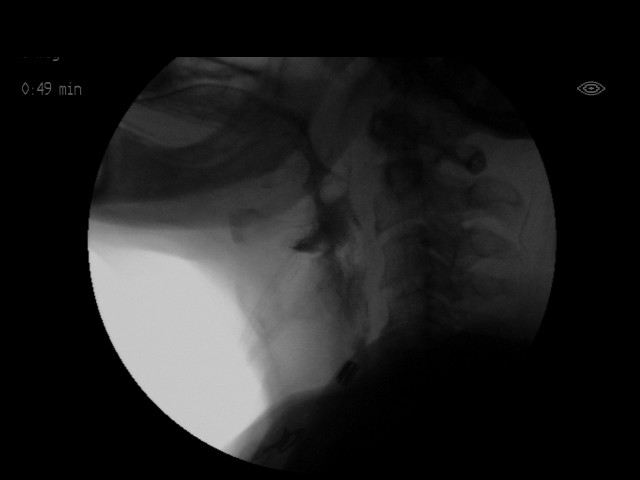
[im 8/15]
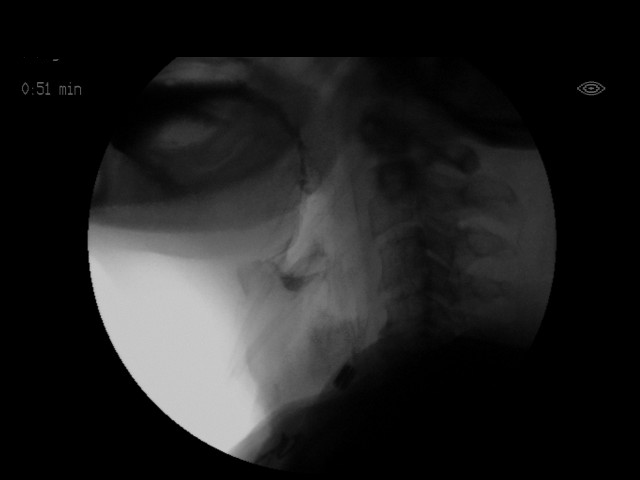
[im 8/15]
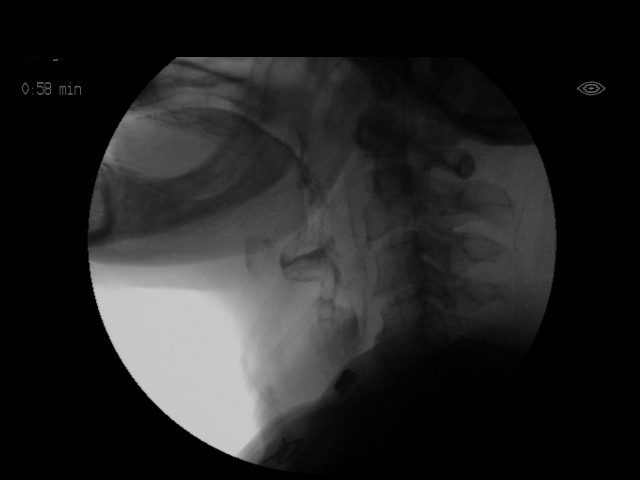
[im 10/15]
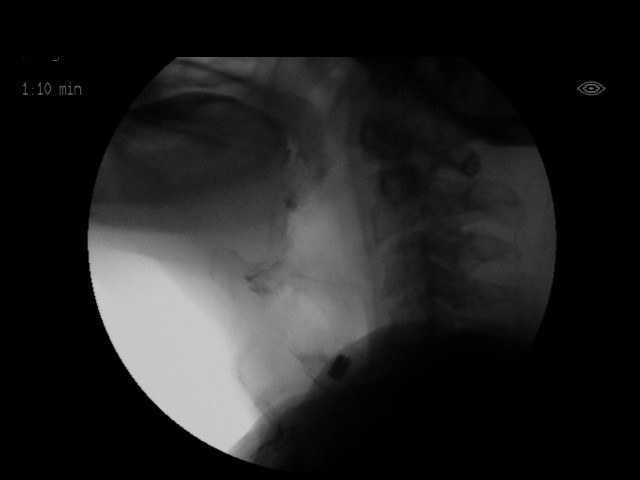
[im 10/15]
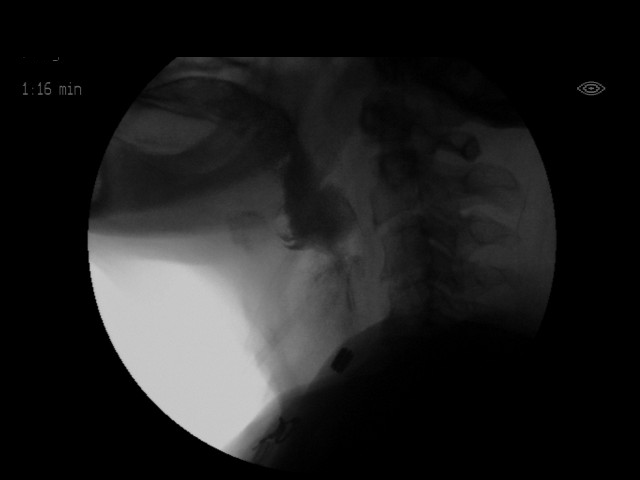
[im 11/15]
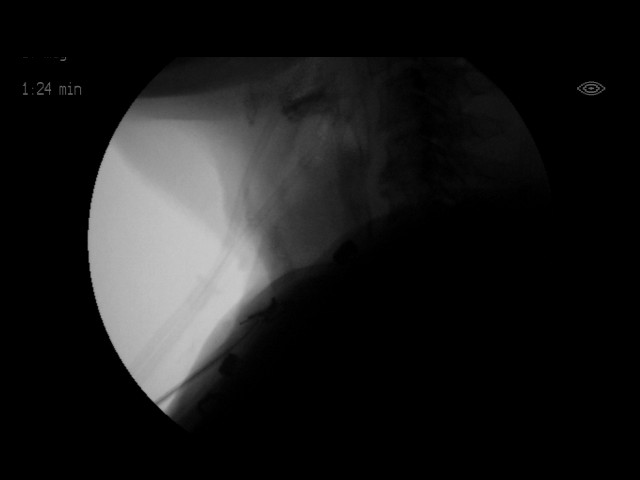
[im 12/15]
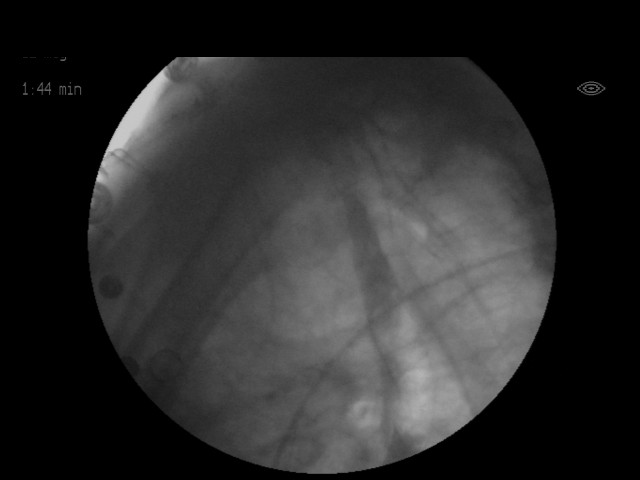
[im 13/15]
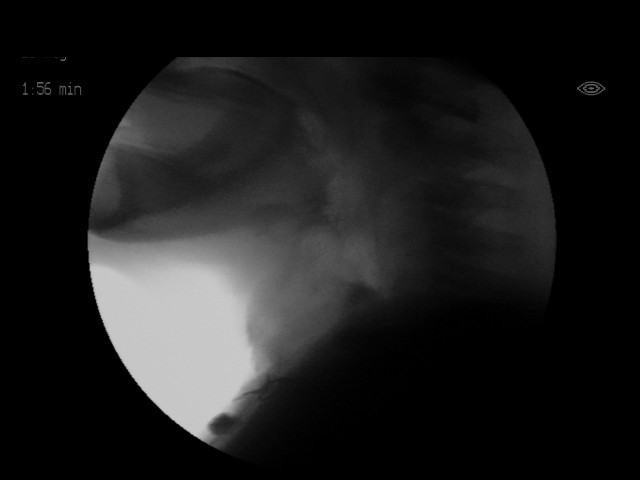
[im 13/15]
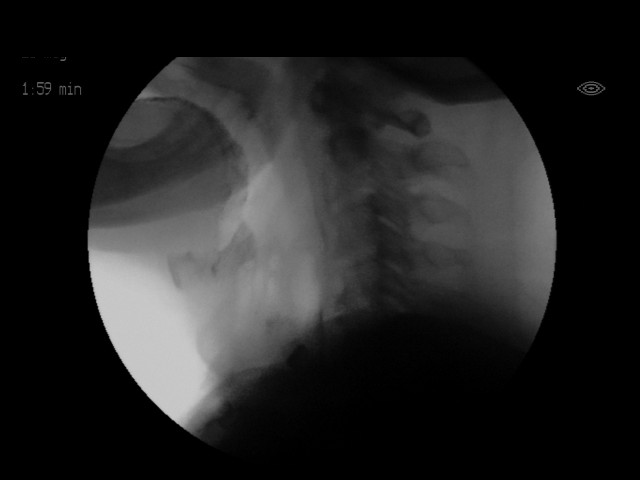
[im 15/15]
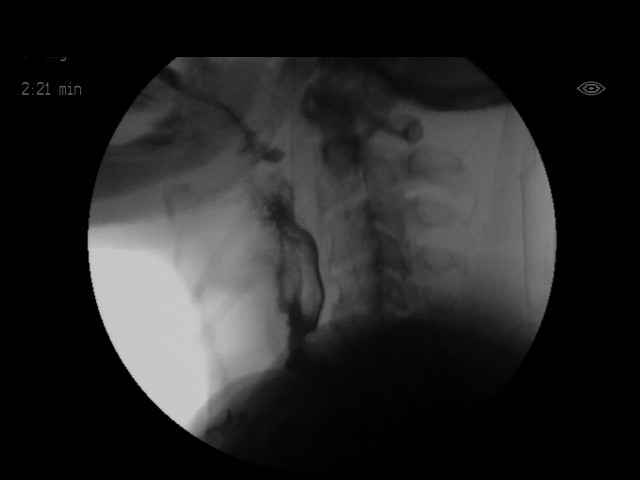
[im 15/15]
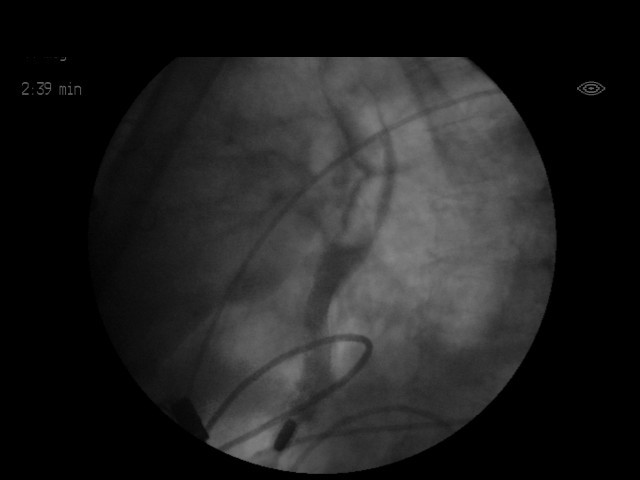

[18 of 24 positions shown; findings below may reference images not displayed]

FLUOROSCOPY FOR SWALLOWING FUNCTION STUDY:
Fluoroscopy was provided for swallowing function study, which was administered by a speech pathologist.  Final results and recommendations from this study are contained within the speech pathology report.

## 2018-01-01 IMAGING — CT CT ABD-PELV W/ CM
4 of 11 series · 13 of 46 positions shown, 17 images · IV contrast (APPLIED)
Comparison: 08/15/2015

CLINICAL DATA: Acute respiratory distress, abdominal distention, pt
has Known AAA

EXAM:
CT ANGIOGRAPHY CHEST
CT ABDOMEN and PELVIS WITH CONTRAST
TECHNIQUE: Multidetector CT imaging of the chest, abdomen, pelvis was performed
using the standard protocol during bolus administration of
intravenous contrast. Multiplanar CT image reconstructions and MIPs
were obtained to evaluate the vascular anatomy.
CONTRAST:  100mL OMNIPAQUE IOHEXOL 350 MG/ML SOLN

[Series 4: pe 3.0 i30f 3 · axial · 0.69mm/px · z∈[-760,-691]mm · 2 of 116 slices shown]
[im 24/116  soft-tissue]
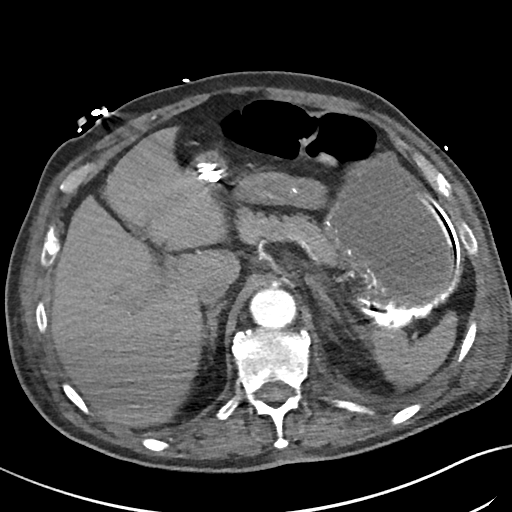
[im 47/116  soft-tissue]
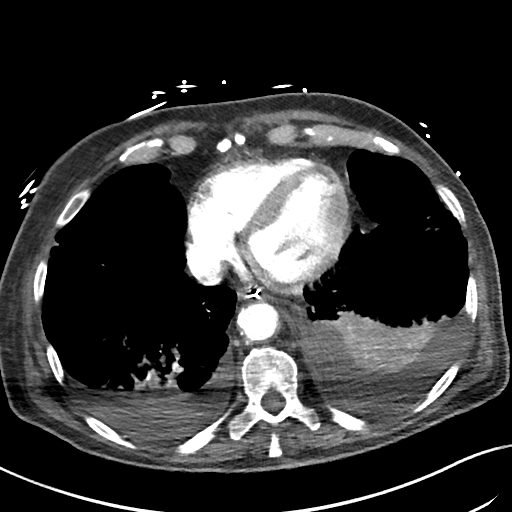

[Series 5: thins · axial · 0.69mm/px · z∈[-808,-506]mm · 8 of 346 slices shown]
[im 22/346  soft-tissue]
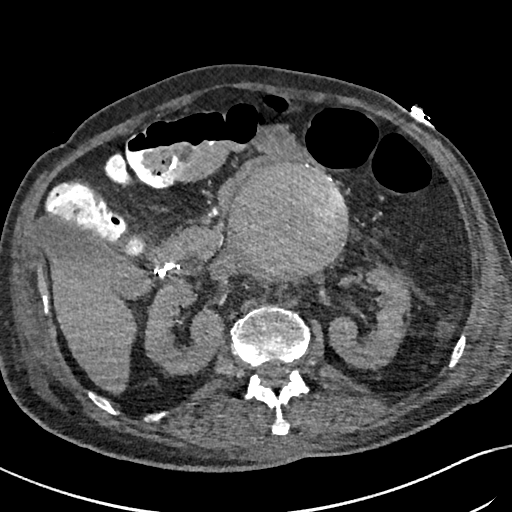
[im 65/346  soft-tissue]
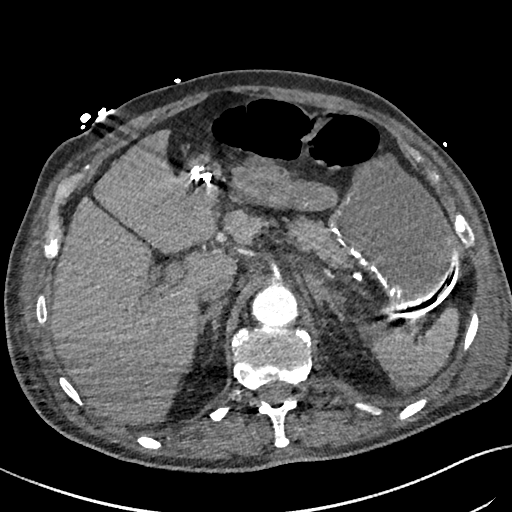
[im 108/346  soft-tissue]
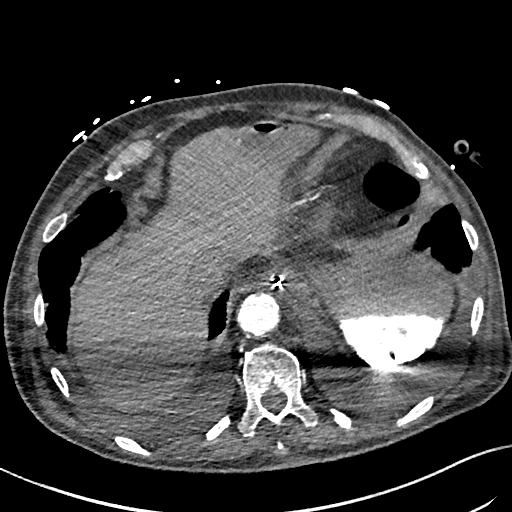
[im 151/346  soft-tissue]
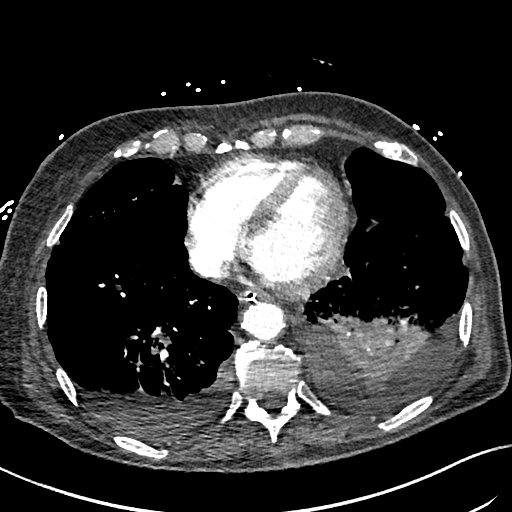
[im 195/346  soft-tissue]
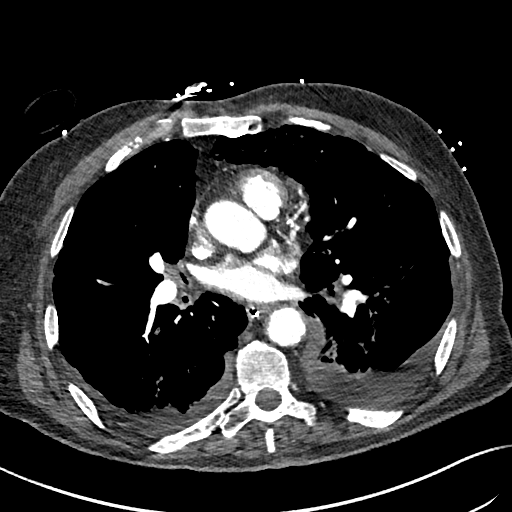
[im 238/346  soft-tissue]
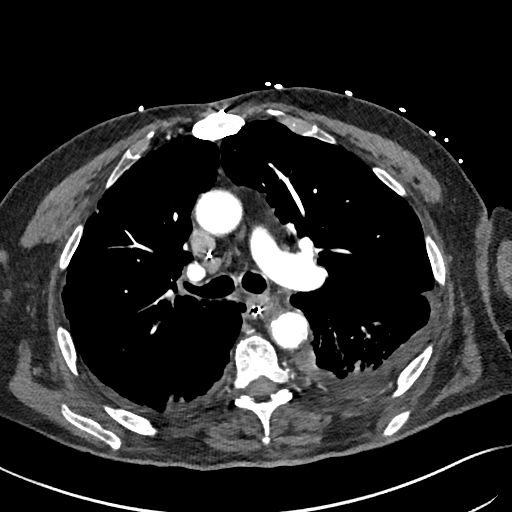
[im 281/346  soft-tissue]
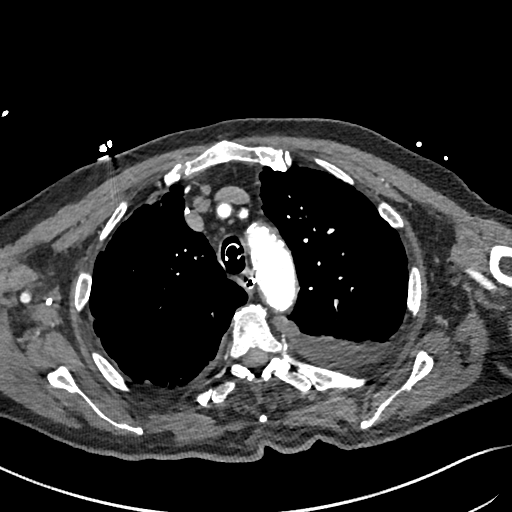
[im 324/346  soft-tissue]
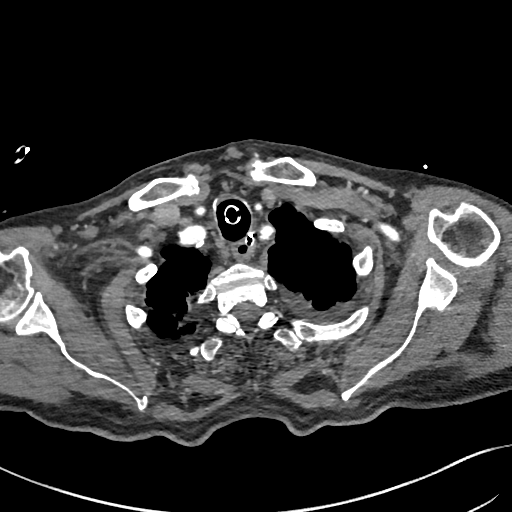

[Series 7: coronal mpr · coronal · 0.68mm/px · 1 of 83 slices shown, 2 images]
[im 42/83  soft-tissue]
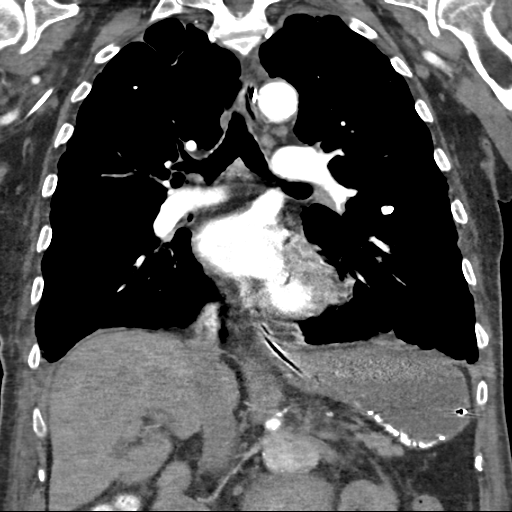
[im 42/83  bone]
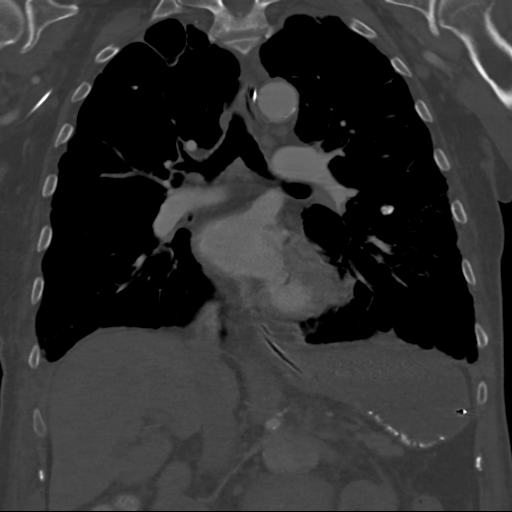

[Series 11: abd/ pelvis 5.0 i30f 1 · axial · 0.69mm/px · z∈[-964,-814]mm · 2 of 91 slices shown, 5 images]
[im 31/91  soft-tissue]
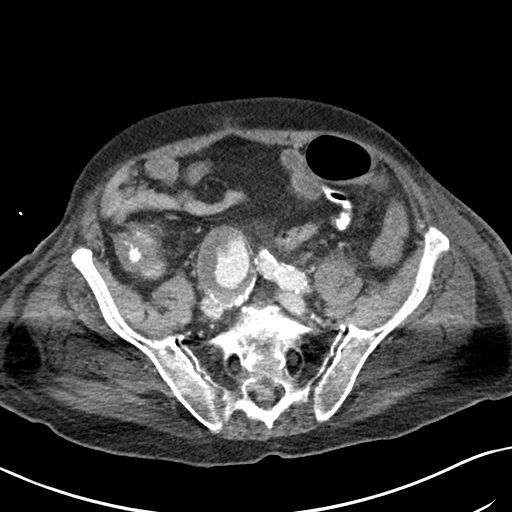
[im 31/91  lung]
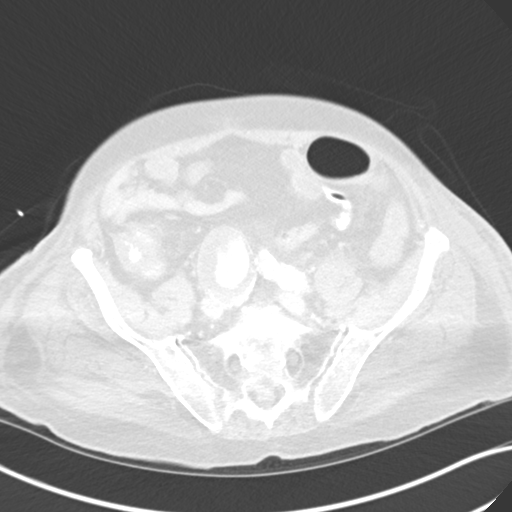
[im 31/91  bone]
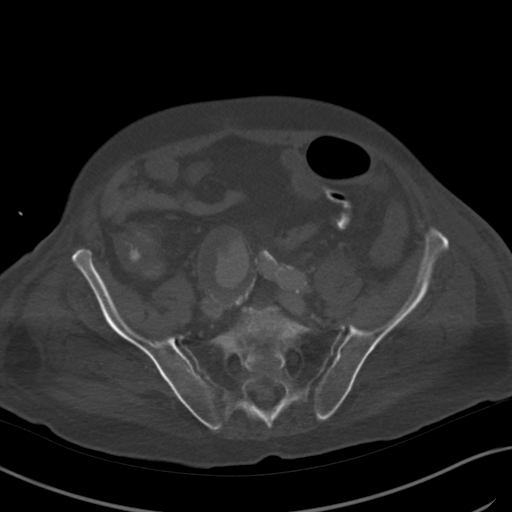
[im 61/91  soft-tissue]
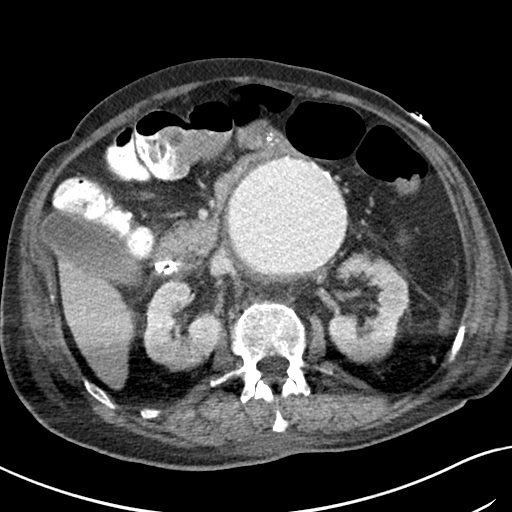
[im 61/91  lung]
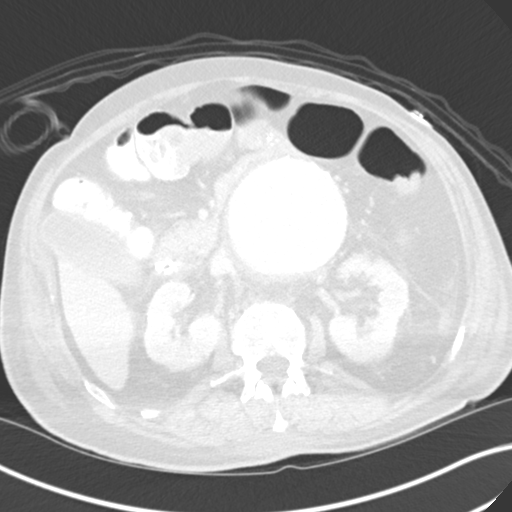

[13 of 46 positions shown; findings below may reference images not displayed]

FINDINGS: CHEST

Vascular: Injection via right arm PICC. There is mild reflux of
contrast from the right atrium into the IVC. The RV is nondilated.
Satisfactory opacification of pulmonary arteries noted, and there is
no evidence of pulmonary emboli. Patent bilateral pulmonary veins.
Scattered coronary calcifications. Previous CABG. Adequate contrast
opacification of the thoracic aorta with no evidence of aneurysm or
stenosis. There is classic 3-vessel brachiocephalic arch anatomy
without proximal stenosis. Atheromatous plaque in the arch and
descending thoracic aorta. Penetrating atheromatous ulcer in the
distal descending aorta on the right, and a short-segment dissection
at the same level on the left, stable since previous exam.

Mediastinum/Lymph Nodes: Subcentimeter AP window, prevascular,
pretracheal, and precarinal lymph nodes. Endotracheal tube
terminates in the distal trachea. No hilar adenopathy is identified.
No pericardial effusion.

Lungs/Pleura: Small pleural effusions left greater than right.
Atelectasis/consolidation posteriorly in both lower lobes, left
worse than right. Extensive emphysematous changes throughout both
lungs with multiple subpleural blebs. Partially calcified 11 mm
granuloma in the anterior right middle lobe.

Musculoskeletal: Spurring in the mid and lower thoracic spine.
Healed median sternotomy with sternotomy wires intact.

ABDOMEN

Vascular: Scattered calcified plaque in the suprarenal aorta. 10 cm
fusiform infrarenal aneurysm extending across the bifurcation, with
contiguous right common iliac artery aneurysm measured up to 4.3 cm
diameter. The left internal iliac artery is ectatic up to 18 mm
diameter. There is some nonobstructing eccentric mural thrombus in
the aneurysmal segments of the aorta and right common iliac artery.
No dissection. No retroperitoneal hematoma. Portal vein patent. IVC
patent. Bilateral pelvic phleboliths.

Hepatobiliary: No masses or other significant abnormality.

Pancreas: No mass, inflammatory changes, or other significant
abnormality.

Spleen: Within normal limits in size and appearance.

Adrenals/Urinary Tract: No masses identified. No evidence of
hydronephrosis.

Stomach/Bowel: Nasogastric tube extends to the gastric antrum.
Stomach is fluid distended with some high-density contrast
posteriorly resulting in significant streak artifact. Small bowel
and colon are nondilated, with scattered oral contrast material.
Normal appendix. One wall of the cecum protrudes into a right
inguinal hernia, without evidence of obstruction or strangulation. A
rectal tube with balloon is in place.

Lymphatic: No pathologically enlarged lymph nodes.

Reproductive: No mass or other significant abnormality.

Other: Right inguinal hernia involving a portion of the cecum. No
ascites. No free air. No retroperitoneal hematoma.

Musculoskeletal: Mild spondylitic changes throughout the lumbar
spine.
IMPRESSION: 1. Negative for acute PE.
2. Advanced emphysema. New bilateral small pleural effusions and
lower lobe atelectasis/consolidation, left greater than right.
3. Short segment dissection/penetrating atheromatous ulcers in the
distal descending thoracic aorta ([REDACTED], [HOSPITAL] B), stable
since previous.
4. Stable 10 cm infrarenal aortic aneurysm and contiguous 4.3 cm
right common iliac artery aneurysm.
5. Right inguinal hernia involving a portion of the cecum, without
obstruction or strangulation.

## 2018-01-02 IMAGING — CR DG CHEST 1V PORT
1 series · 1 of 1 positions shown · non-contrast
Comparison: Chest radiograph and chest CT August 21, 2015

CLINICAL DATA: Hypoxia ; shortness of breath

EXAM:
PORTABLE CHEST 1 VIEW

[AP]
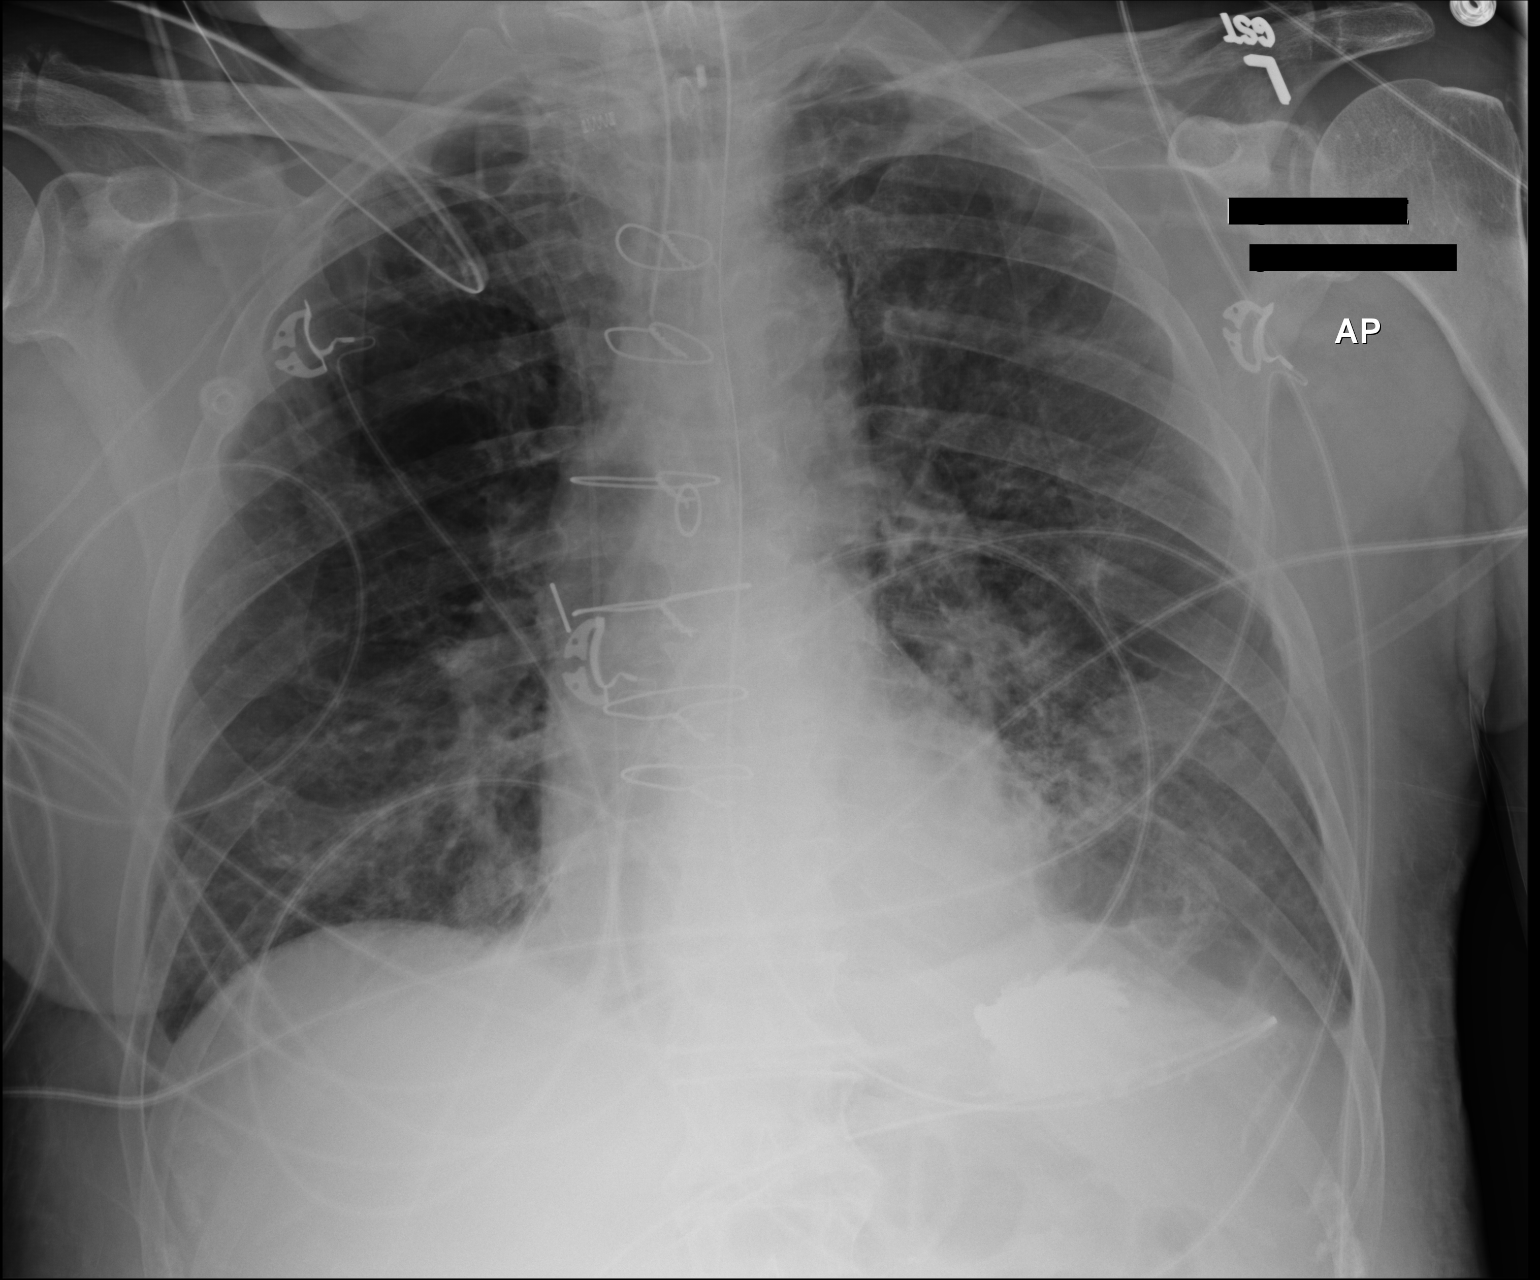

[1 of 1 positions shown; findings below may reference images not displayed]

FINDINGS: Endotracheal tube tip is 2.6 cm above the carina. Central catheter
tip is in the superior vena cava. Nasogastric tube tip and side port
are in the distal stomach region. There is no appreciable
pneumothorax. There is underlying emphysematous change. There is
patchy airspace consolidation in the left lower lobe region. There
is a small left pleural effusion. There is no edema or consolidation
currently apparent on the right. The heart size is normal. The
pulmonary vascularity is stable, reflecting the underlying
emphysematous change. No adenopathy evident. Patient is status post
coronary artery bypass grafting.
IMPRESSION: Tube and catheter positions as described without pneumothorax.
Underlying emphysematous change. Patchy infiltrate left base region.
Small left pleural effusion. Right lung clear. No change in cardiac
silhouette.
